# Patient Record
Sex: Male | Born: 1980 | Race: White | Hispanic: No | Marital: Married | Smoking: Never smoker
Health system: Southern US, Community
[De-identification: ages and names within clinical notes are randomized; demographics above are authoritative.]

## PROBLEM LIST (undated history)

## (undated) DIAGNOSIS — Z87442 Personal history of urinary calculi: Secondary | ICD-10-CM

## (undated) DIAGNOSIS — N200 Calculus of kidney: Secondary | ICD-10-CM

## (undated) DIAGNOSIS — M109 Gout, unspecified: Secondary | ICD-10-CM

## (undated) DIAGNOSIS — B019 Varicella without complication: Secondary | ICD-10-CM

## (undated) DIAGNOSIS — A389 Scarlet fever, uncomplicated: Secondary | ICD-10-CM

## (undated) HISTORY — DX: Varicella without complication: B01.9

## (undated) HISTORY — DX: Scarlet fever, uncomplicated: A38.9

## (undated) HISTORY — DX: Calculus of kidney: N20.0

---

## 1999-03-09 ENCOUNTER — Encounter: Admission: RE | Admit: 1999-03-09 | Discharge: 1999-03-24 | Payer: Self-pay | Admitting: *Deleted

## 2013-02-13 ENCOUNTER — Emergency Department: Payer: Self-pay | Admitting: Emergency Medicine

## 2013-04-30 ENCOUNTER — Emergency Department: Payer: Self-pay | Admitting: Emergency Medicine

## 2014-03-23 ENCOUNTER — Emergency Department: Payer: Self-pay | Admitting: Emergency Medicine

## 2014-04-02 ENCOUNTER — Emergency Department (HOSPITAL_COMMUNITY): Payer: Worker's Compensation

## 2014-04-02 ENCOUNTER — Encounter (HOSPITAL_COMMUNITY): Payer: Self-pay | Admitting: Adult Health

## 2014-04-02 ENCOUNTER — Emergency Department (HOSPITAL_COMMUNITY)
Admission: EM | Admit: 2014-04-02 | Discharge: 2014-04-02 | Disposition: A | Discharge status: Home / Self Care | Payer: Worker's Compensation | Attending: Emergency Medicine | Admitting: Emergency Medicine

## 2014-04-02 DIAGNOSIS — Y998 Other external cause status: Secondary | ICD-10-CM | POA: Diagnosis not present

## 2014-04-02 DIAGNOSIS — Y9289 Other specified places as the place of occurrence of the external cause: Secondary | ICD-10-CM | POA: Insufficient documentation

## 2014-04-02 DIAGNOSIS — Z79899 Other long term (current) drug therapy: Secondary | ICD-10-CM | POA: Insufficient documentation

## 2014-04-02 DIAGNOSIS — S20229A Contusion of unspecified back wall of thorax, initial encounter: Secondary | ICD-10-CM

## 2014-04-02 DIAGNOSIS — S199XXA Unspecified injury of neck, initial encounter: Secondary | ICD-10-CM | POA: Insufficient documentation

## 2014-04-02 DIAGNOSIS — S6991XA Unspecified injury of right wrist, hand and finger(s), initial encounter: Secondary | ICD-10-CM | POA: Insufficient documentation

## 2014-04-02 DIAGNOSIS — W19XXXA Unspecified fall, initial encounter: Secondary | ICD-10-CM

## 2014-04-02 DIAGNOSIS — M109 Gout, unspecified: Secondary | ICD-10-CM | POA: Insufficient documentation

## 2014-04-02 DIAGNOSIS — Y9389 Activity, other specified: Secondary | ICD-10-CM | POA: Diagnosis not present

## 2014-04-02 DIAGNOSIS — S6992XA Unspecified injury of left wrist, hand and finger(s), initial encounter: Secondary | ICD-10-CM | POA: Insufficient documentation

## 2014-04-02 DIAGNOSIS — Z791 Long term (current) use of non-steroidal anti-inflammatories (NSAID): Secondary | ICD-10-CM | POA: Insufficient documentation

## 2014-04-02 DIAGNOSIS — S29002A Unspecified injury of muscle and tendon of back wall of thorax, initial encounter: Secondary | ICD-10-CM | POA: Diagnosis present

## 2014-04-02 DIAGNOSIS — W11XXXA Fall on and from ladder, initial encounter: Secondary | ICD-10-CM | POA: Insufficient documentation

## 2014-04-02 HISTORY — DX: Gout, unspecified: M10.9

## 2014-04-02 LAB — COMPREHENSIVE METABOLIC PANEL
ALT: 45 U/L (ref 0–53)
AST: 36 U/L (ref 0–37)
Albumin: 4.1 g/dL (ref 3.5–5.2)
Alkaline Phosphatase: 53 U/L (ref 39–117)
Anion gap: 15 (ref 5–15)
BILIRUBIN TOTAL: 0.5 mg/dL (ref 0.3–1.2)
BUN: 15 mg/dL (ref 6–23)
CHLORIDE: 98 meq/L (ref 96–112)
CO2: 24 meq/L (ref 19–32)
Calcium: 9.7 mg/dL (ref 8.4–10.5)
Creatinine, Ser: 0.94 mg/dL (ref 0.50–1.35)
GFR calc Af Amer: >90 mL/min (ref >90)
Glucose, Bld: 101 mg/dL — ABNORMAL HIGH (ref 70–99)
Potassium: 3.7 mEq/L (ref 3.7–5.3)
SODIUM: 137 meq/L (ref 137–147)
Total Protein: 7.8 g/dL (ref 6.0–8.3)

## 2014-04-02 LAB — CBC
HCT: 44.7 % (ref 39.0–52.0)
Hemoglobin: 15.6 g/dL (ref 13.0–17.0)
MCH: 29.5 pg (ref 26.0–34.0)
MCHC: 34.9 g/dL (ref 30.0–36.0)
MCV: 84.5 fL (ref 78.0–100.0)
PLATELETS: 261 10*3/uL (ref 150–400)
RBC: 5.29 MIL/uL (ref 4.22–5.81)
RDW: 12.4 % (ref 11.5–15.5)
WBC: 12.5 10*3/uL — ABNORMAL HIGH (ref 4.0–10.5)

## 2014-04-02 LAB — SAMPLE TO BLOOD BANK

## 2014-04-02 MED ORDER — OXYCODONE-ACETAMINOPHEN 5-325 MG PO TABS: 1.0000 | ORAL_TABLET | ORAL | Status: DC | PRN

## 2014-04-02 MED ORDER — HYDROMORPHONE HCL 1 MG/ML IJ SOLN
1.0000 mg | Freq: Once | INTRAMUSCULAR | Status: AC
  Administered 2014-04-02: 1 mg via INTRAVENOUS
  Filled 2014-04-02: qty 1

## 2014-04-02 MED ORDER — IOHEXOL 300 MG/ML  SOLN
100.0000 mL | Freq: Once | INTRAMUSCULAR | Status: AC | PRN
Start: 2014-04-02 — End: 2014-04-02
  Administered 2014-04-02: 100 mL via INTRAVENOUS

## 2014-04-02 MED ORDER — CYCLOBENZAPRINE HCL 10 MG PO TABS: 10.0000 mg | ORAL_TABLET | Freq: Two times a day (BID) | ORAL | Status: DC | PRN

## 2014-04-02 MED ORDER — SODIUM CHLORIDE 0.9 % IV SOLN
1000.0000 mL | INTRAVENOUS | Status: DC
  Administered 2014-04-02: 1000 mL via INTRAVENOUS

## 2014-04-02 MED ORDER — SODIUM CHLORIDE 0.9 % IV SOLN
1000.0000 mL | Freq: Once | INTRAVENOUS | Status: AC
  Administered 2014-04-02: 1000 mL via INTRAVENOUS

## 2014-04-02 MED ORDER — HYDROMORPHONE HCL 1 MG/ML IJ SOLN
0.5000 mg | Freq: Once | INTRAMUSCULAR | Status: AC
  Administered 2014-04-02: 0.5 mg via INTRAVENOUS
  Filled 2014-04-02: qty 1

## 2014-04-02 NOTE — ED Notes (Signed)
Phlebotomy at the bedside  

## 2014-04-02 NOTE — ED Provider Notes (Signed)
CSN: [id number]     Arrival date & time 04/02/14  1644 History   First MD Initiated Contact with Patient 04/02/14 1645     Chief Complaint  Patient presents with  . Fall     (Consider location/radiation/quality/duration/timing/severity/associated sxs/prior Treatment) HPI Patient is an otherwise healthy 33 year old male who presents following a fall. The patient was standing approximately 9 feet up on a ladder when he slipped and fell backwards. He landed on his back, and thinks he may have lost consciousness, is unsure whether he hit his head. He complains of mid thoracic back pain, and a subjective feeling of numbness in bilateral upper extremities. He also complains of left hand pain, primarily in his thumb. He denies any weakness in his legs, but does feel a sensation of numbness over his right hip and anterior thigh.  He received 150 g of fentanyl by EMS which has improved his pain from 8 out of 10 to a 4 out of 10.   Past Medical History  Diagnosis Date  . Gout    History reviewed. No pertinent past surgical history. History reviewed. No pertinent family history. History  Substance Use Topics  . Smoking status: Never Smoker   . Smokeless tobacco: Not on file  . Alcohol Use: No    Review of Systems  Unable to perform ROS Musculoskeletal: Positive for back pain and neck pain.  Neurological: Positive for weakness (Bilateral upper extremities) and numbness. Negative for tremors (bilateral upper extremities).  All other systems reviewed and are negative.     Allergies  Review of patient's allergies indicates no known allergies.  Home Medications   Prior to Admission medications   Medication Sig Start Date End Date Taking? Authorizing Provider  DiphenhydrAMINE HCl (ALLERGY MEDICATION PO) Take 1 tablet by mouth daily as needed (for allergies).   Yes Historical Provider, MD  ibuprofen (ADVIL,MOTRIN) 200 MG tablet Take 200 mg by mouth every 6 (six) hours as needed for  moderate pain.    Yes Historical Provider, MD  neomycin-polymyxin-hydrocortisone (CORTISPORIN) 3.5-10000-1 ophthalmic suspension Place 1 drop into the left eye daily as needed (for eye infection).  03/23/14  Yes Historical Provider, MD  Pseudoephedrine HCl (SINUS DECONGESTANT PO) Take 1 tablet by mouth daily as needed (for sinuses).   Yes Historical Provider, MD  cyclobenzaprine (FLEXERIL) 10 MG tablet Take 1 tablet (10 mg total) by mouth 2 (two) times daily as needed for muscle spasms. 04/02/14   Erskine Emeryhris Rory Montel, MD  oxyCODONE-acetaminophen (PERCOCET/ROXICET) 5-325 MG per tablet Take 1-2 tablets by mouth every 4 (four) hours as needed for moderate pain or severe pain. 04/02/14   Erskine Emeryhris Gulianna Hornsby, MD   BP 107/60 mmHg  Pulse 78  Temp(Src) 98.1 F (36.7 C) (Oral)  Resp 13  SpO2 95% Physical Exam  Constitutional: He appears well-developed and well-nourished. No distress.  HENT:  Head: Normocephalic and atraumatic.  No hemotympanum or nasal septal hematoma, no signs of basilar skull fracture  Eyes: EOM are normal. Pupils are equal, round, and reactive to light.  Pupils 2-3 mm reactive bilaterally  Neck:  No tenderness to palpation to the cervical spine  Cardiovascular: Normal rate and regular rhythm.   Pulmonary/Chest: Effort normal and breath sounds normal. No respiratory distress.  Abdominal: Soft. He exhibits no distension. There is no tenderness.  Musculoskeletal:  Pain upon palpation to the left thumb at the MP joint Bilateral upper and lower extremities appear atraumatic  Neurological:  3 out of 5 strength in bilateral upper and lower extremities.  Subjective decrease in sensation in bilateral upper extremities in all nerve distributions  Skin: Skin is warm and dry.  Psychiatric: He has a normal mood and affect.  Nursing note and vitals reviewed.   ED Course  Procedures (including critical care time) Labs Review Labs Reviewed  COMPREHENSIVE METABOLIC PANEL - Abnormal; Notable for the  following:    Glucose, Bld 101 (*)    All other components within normal limits  CBC - Abnormal; Notable for the following:    WBC 12.5 (*)    All other components within normal limits  URINE RAPID DRUG SCREEN (HOSP PERFORMED)  SAMPLE TO BLOOD BANK    Imaging Review Ct Head Wo Contrast  04/02/2014   CLINICAL DATA:  Status post fall off a 8 foot ladder and landed on concrete. Low back pain.  EXAM: CT HEAD WITHOUT CONTRAST  CT CERVICAL SPINE WITHOUT CONTRAST  TECHNIQUE: Multidetector CT imaging of the head and cervical spine was performed following the standard protocol without intravenous contrast. Multiplanar CT image reconstructions of the cervical spine were also generated.  COMPARISON:  None.  FINDINGS: CT HEAD FINDINGS  There is no evidence of mass effect, midline shift or extra-axial fluid collections. There is no evidence of a space-occupying lesion or intracranial hemorrhage. There is no evidence of a cortical-based area of acute infarction.  The ventricles and sulci are appropriate for the patient's age. The basal cisterns are patent.  Visualized portions of the orbits are unremarkable. The visualized portions of the paranasal sinuses and mastoid air cells are unremarkable.  The osseous structures are unremarkable.  CT CERVICAL SPINE FINDINGS  The alignment is anatomic. The vertebral body heights are maintained. There is no acute fracture. There is no static listhesis. The prevertebral soft tissues are normal. The intraspinal soft tissues are not fully imaged on this examination due to poor soft tissue contrast, but there is no gross soft tissue abnormality.  The disc spaces are maintained.  The visualized portions of the lung apices demonstrate no focal abnormality.  IMPRESSION: 1. Normal CT of the brain without intravenous contrast. 2. No acute osseous injury of the cervical spine.   Electronically Signed   By: Elige Ko   On: 04/02/2014 18:08   Ct Chest W Contrast  04/02/2014    CLINICAL DATA:  Fall from 8 foot ladder with low back pain and bilateral hand and thigh and numbness, initial encounter  EXAM: CT CHEST, ABDOMEN, AND PELVIS WITH CONTRAST  TECHNIQUE: Multidetector CT imaging of the chest, abdomen and pelvis was performed following the standard protocol during bolus administration of intravenous contrast.  CONTRAST:  OMNIPAQUE IOHEXOL 300 MG/ML  SOLN  COMPARISON:  None.  FINDINGS: CT CHEST FINDINGS  The lungs are well aerated bilaterally. Some very mild dependent atelectatic changes are noted bilaterally. No focal infiltrate or parenchymal nodules are seen. The thoracic inlet is within normal limits.  The thoracic aorta is within normal limits without evidence of dissection or aneurysmal dilatation. The pulmonary artery as visualized is within normal limits. No hilar or mediastinal adenopathy is seen.  No acute bony abnormality is noted.  CT ABDOMEN AND PELVIS FINDINGS  Liver is diffusely fatty infiltrated. The gallbladder, spleen, adrenal glands and pancreas are all normal in their CT appearance. The kidneys are well visualized bilaterally and reveal no focal injury. No obstructive changes are seen. The bladder is well distended.  No free pelvic fluid is seen. The appendix is within normal limits. No acute bony abnormality is noted.  IMPRESSION: No acute abnormality is identified in the chest, abdomen and pelvis related to the recent injury.   Electronically Signed   By: Alcide CleverMark  Lukens M.D.   On: 04/02/2014 18:06   Ct Cervical Spine Wo Contrast  04/02/2014   CLINICAL DATA:  Status post fall off a 8 foot ladder and landed on concrete. Low back pain.  EXAM: CT HEAD WITHOUT CONTRAST  CT CERVICAL SPINE WITHOUT CONTRAST  TECHNIQUE: Multidetector CT imaging of the head and cervical spine was performed following the standard protocol without intravenous contrast. Multiplanar CT image reconstructions of the cervical spine were also generated.  COMPARISON:  None.  FINDINGS: CT HEAD  FINDINGS  There is no evidence of mass effect, midline shift or extra-axial fluid collections. There is no evidence of a space-occupying lesion or intracranial hemorrhage. There is no evidence of a cortical-based area of acute infarction.  The ventricles and sulci are appropriate for the patient's age. The basal cisterns are patent.  Visualized portions of the orbits are unremarkable. The visualized portions of the paranasal sinuses and mastoid air cells are unremarkable.  The osseous structures are unremarkable.  CT CERVICAL SPINE FINDINGS  The alignment is anatomic. The vertebral body heights are maintained. There is no acute fracture. There is no static listhesis. The prevertebral soft tissues are normal. The intraspinal soft tissues are not fully imaged on this examination due to poor soft tissue contrast, but there is no gross soft tissue abnormality.  The disc spaces are maintained.  The visualized portions of the lung apices demonstrate no focal abnormality.  IMPRESSION: 1. Normal CT of the brain without intravenous contrast. 2. No acute osseous injury of the cervical spine.   Electronically Signed   By: Elige KoHetal  Patel   On: 04/02/2014 18:08   Ct Abdomen Pelvis W Contrast  04/02/2014   CLINICAL DATA:  Fall from 8 foot ladder with low back pain and bilateral hand and thigh and numbness, initial encounter  EXAM: CT CHEST, ABDOMEN, AND PELVIS WITH CONTRAST  TECHNIQUE: Multidetector CT imaging of the chest, abdomen and pelvis was performed following the standard protocol during bolus administration of intravenous contrast.  CONTRAST:  100mL OMNIPAQUE IOHEXOL 300 MG/ML  SOLN  COMPARISON:  None.  FINDINGS: CT CHEST FINDINGS  The lungs are well aerated bilaterally. Some very mild dependent atelectatic changes are noted bilaterally. No focal infiltrate or parenchymal nodules are seen. The thoracic inlet is within normal limits.  The thoracic aorta is within normal limits without evidence of dissection or aneurysmal  dilatation. The pulmonary artery as visualized is within normal limits. No hilar or mediastinal adenopathy is seen.  No acute bony abnormality is noted.  CT ABDOMEN AND PELVIS FINDINGS  Liver is diffusely fatty infiltrated. The gallbladder, spleen, adrenal glands and pancreas are all normal in their CT appearance. The kidneys are well visualized bilaterally and reveal no focal injury. No obstructive changes are seen. The bladder is well distended.  No free pelvic fluid is seen. The appendix is within normal limits. No acute bony abnormality is noted.  IMPRESSION: No acute abnormality is identified in the chest, abdomen and pelvis related to the recent injury.   Electronically Signed   By: Alcide CleverMark  Lukens M.D.   On: 04/02/2014 18:06   Dg Hand Complete Left  04/02/2014   CLINICAL DATA:  Status post fall, left hand pain  EXAM: LEFT HAND - COMPLETE 3+ VIEW  COMPARISON:  None.  FINDINGS: There is no evidence of fracture or dislocation. There is  no evidence of arthropathy or other focal bone abnormality. Soft tissues are unremarkable.  IMPRESSION: Negative.   Electronically Signed   By: Elige Ko   On: 04/02/2014 18:12     EKG Interpretation None      MDM   Final diagnoses:  Fall  Back contusion, unspecified laterality, initial encounter    33 year old male presenting with thoracic back pain. Fell partially 8 feet landing on his upper back. Pain and tenderness along his thoracic spine but no step-offs or deformity. Initially had diminished strength in bilateral upper and lower extremities, however after pain control strength returned and was 5 out of 5 in upper and lower extremities. He had no decreased sensation in any of his extremities, however did describe paresthesias involving the entire circumference of all 4 fingers distal to the MCP joint bilaterally. He was able to discriminate 2 points, and sharp and dull stimuli. CT did not show any acute injuries to the thoracic back, no chest trauma no  abdominal trauma bleeding or pelvic fracture. CT of the head does not show any abnormalities, and there is no fracture set by the cervical spine.  After multiple doses of pain medication, the patient's pain was well controlled, and with no acute injuries identified on physical exam or CT, we'll discharge home with return precautions.   Erskine Emery, MD 04/02/14 1610  Gerhard Munch, MD 04/03/14 [phone]

## 2014-04-02 NOTE — ED Notes (Signed)
Patient returned from CT and Xray

## 2014-04-02 NOTE — ED Notes (Signed)
MD Davis at the bedside.  

## 2014-04-02 NOTE — ED Notes (Signed)
Presents with fall from 9 feet on ladder, landed on concrete  On right side-c/o numbness  in both upper extremities and pain in spine below shoulder blades.  MAE x4. Pain rated 8/10 initially-given 150 mcg of fentanyl by EMS and believes left thumb is broken. Good grips bilaterally.

## 2014-04-02 NOTE — Discharge Instructions (Signed)
Blunt Chest Trauma Blunt chest trauma is an injury caused by a blow to the chest. These chest injuries can be very painful. Blunt chest trauma often results in bruised or broken (fractured) ribs. Most cases of bruised and fractured ribs from blunt chest traumas get better after 1 to 3 weeks of rest and pain medicine. Often, the soft tissue in the chest wall is also injured, causing pain and bruising. Internal organs, such as the heart and lungs, may also be injured. Blunt chest trauma can lead to serious medical problems. This injury requires immediate medical care. CAUSES   Motor vehicle collisions.  Falls.  Physical violence.  Sports injuries. SYMPTOMS   Chest pain. The pain may be worse when you move or breathe deeply.  Shortness of breath.  Lightheadedness.  Bruising.  Tenderness.  Swelling. DIAGNOSIS  Your caregiver will do a physical exam. X-rays may be taken to look for fractures. However, minor rib fractures may not show up on X-rays until a few days after the injury. If a more serious injury is suspected, further imaging tests may be done. This may include ultrasounds, computed tomography (CT) scans, or magnetic resonance imaging (MRI). TREATMENT  Treatment depends on the severity of your injury. Your caregiver may prescribe pain medicines and deep breathing exercises. HOME CARE INSTRUCTIONS  Limit your activities until you can move around without much pain.  Do not do any strenuous work until your injury is healed.  Put ice on the injured area.  Put ice in a plastic bag.  Place a towel between your skin and the bag.  Leave the ice on for 15-20 minutes, 03-04 times a day.  You may wear a rib belt as directed by your caregiver to reduce pain.  Practice deep breathing as directed by your caregiver to keep your lungs clear.  Only take over-the-counter or prescription medicines for pain, fever, or discomfort as directed by your caregiver. SEEK IMMEDIATE MEDICAL  CARE IF:   You have increasing pain or shortness of breath.  You cough up blood.  You have nausea, vomiting, or abdominal pain.  You have a fever.  You feel dizzy, weak, or you faint. MAKE SURE YOU:  Understand these instructions.  Will watch your condition. Will get help right away if you are not doing well or get worse.Back Pain, Adult Low back pain is very common. About 1 in 5 people have back pain.The cause of low back pain is rarely dangerous. The pain often gets better over time.About half of people with a sudden onset of back pain feel better in just 2 weeks. About 8 in 10 people feel better by 6 weeks.  CAUSES Some common causes of back pain include: Strain of the muscles or ligaments supporting the spine. Wear and tear (degeneration) of the spinal discs. Arthritis. Direct injury to the back. DIAGNOSIS Most of the time, the direct cause of low back pain is not known.However, back pain can be treated effectively even when the exact cause of the pain is unknown.Answering your caregiver's questions about your overall health and symptoms is one of the most accurate ways to make sure the cause of your pain is not dangerous. If your caregiver needs more information, he or she may order lab work or imaging tests (X-rays or MRIs).However, even if imaging tests show changes in your back, this usually does not require surgery. HOME CARE INSTRUCTIONS For many people, back pain returns.Since low back pain is rarely dangerous, it is often a condition that  people can learn to [facility] [place] [facility]on their own.  Remain active. It is stressful on the back to sit or stand in one place. Do not sit, drive, or stand in one place for more than 30 minutes at a time. Take short walks on level surfaces as soon as pain allows.Try to increase the length of time you walk each day. Do not stay in bed.Resting more than 1 or 2 days can delay your recovery. Do not avoid exercise or work.Your body is made to  move.It is not dangerous to be active, even though your back may hurt.Your back will likely heal faster if you return to being active before your pain is gone. Pay attention to your body when you bend and lift. Many people have less discomfortwhen lifting if they bend their knees, keep the load close to their bodies,and avoid twisting. Often, the most comfortable positions are those that put less stress on your recovering back. Find a comfortable position to sleep. Use a firm mattress and lie on your side with your knees slightly bent. If you lie on your back, put a pillow under your knees. Only take over-the-counter or prescription medicines as directed by your caregiver. Over-the-counter medicines to reduce pain and inflammation are often the most helpful.Your caregiver may prescribe muscle relaxant drugs.These medicines help dull your pain so you can more quickly return to your normal activities and healthy exercise. Put ice on the injured area. Put ice in a plastic bag. Place a towel between your skin and the bag. Leave the ice on for 15-20 minutes, 03-04 times a day for the first 2 to 3 days. After that, ice and heat may be alternated to reduce pain and spasms. Ask your caregiver about trying back exercises and gentle massage. This may be of some benefit. Avoid feeling anxious or stressed.Stress increases muscle tension and can worsen back pain.It is important to recognize when you are anxious or stressed and learn ways to manage it.Exercise is a great option. SEEK MEDICAL CARE IF: You have pain that is not relieved with rest or medicine. You have pain that does not improve in 1 week. You have new symptoms. You are generally not feeling well. SEEK IMMEDIATE MEDICAL CARE IF:  You have pain that radiates from your back into your legs. You develop new bowel or bladder control problems. You have unusual weakness or numbness in your arms or legs. You develop nausea or vomiting. You  develop abdominal pain. You feel faint. Document Released: 04/26/2005 Document Revised: 10/26/2011 Document Reviewed: 08/28/2013 [facility] Patient Information 2015 [employer], [place]. This information is not intended to replace advice given to you by your health care provider. Make sure you discuss any questions you have with your health care provider.   Document Released: 06/03/2004 Document Revised: 07/19/2011 Document Reviewed: 02/10/2011 [facility] Patient Information 2015 [place], [place]. This information is not intended to replace advice given to you by your health care provider. Make sure you discuss any questions you have with your health care provider.

## 2014-05-23 ENCOUNTER — Ambulatory Visit: Payer: Self-pay | Admitting: Internal Medicine

## 2014-07-02 ENCOUNTER — Ambulatory Visit: Payer: Self-pay | Admitting: Internal Medicine

## 2014-08-08 ENCOUNTER — Emergency Department: Admit: 2014-08-08 | Disposition: A | Discharge status: Home / Self Care | Payer: Self-pay | Admitting: Student

## 2014-08-15 ENCOUNTER — Emergency Department (HOSPITAL_COMMUNITY)
Admission: EM | Admit: 2014-08-15 | Discharge: 2014-08-15 | Disposition: A | Discharge status: Home / Self Care | Payer: Worker's Compensation | Attending: Emergency Medicine | Admitting: Emergency Medicine

## 2014-08-15 ENCOUNTER — Emergency Department (HOSPITAL_COMMUNITY): Payer: Worker's Compensation

## 2014-08-15 ENCOUNTER — Encounter (HOSPITAL_COMMUNITY): Payer: Self-pay | Admitting: Physical Medicine and Rehabilitation

## 2014-08-15 DIAGNOSIS — S6992XA Unspecified injury of left wrist, hand and finger(s), initial encounter: Secondary | ICD-10-CM | POA: Diagnosis present

## 2014-08-15 DIAGNOSIS — Y9289 Other specified places as the place of occurrence of the external cause: Secondary | ICD-10-CM | POA: Insufficient documentation

## 2014-08-15 DIAGNOSIS — Y998 Other external cause status: Secondary | ICD-10-CM | POA: Insufficient documentation

## 2014-08-15 DIAGNOSIS — Z79899 Other long term (current) drug therapy: Secondary | ICD-10-CM | POA: Insufficient documentation

## 2014-08-15 DIAGNOSIS — Z792 Long term (current) use of antibiotics: Secondary | ICD-10-CM | POA: Diagnosis not present

## 2014-08-15 DIAGNOSIS — S63602A Unspecified sprain of left thumb, initial encounter: Secondary | ICD-10-CM | POA: Diagnosis not present

## 2014-08-15 DIAGNOSIS — Z8739 Personal history of other diseases of the musculoskeletal system and connective tissue: Secondary | ICD-10-CM | POA: Insufficient documentation

## 2014-08-15 DIAGNOSIS — Y9389 Activity, other specified: Secondary | ICD-10-CM | POA: Insufficient documentation

## 2014-08-15 DIAGNOSIS — X58XXXA Exposure to other specified factors, initial encounter: Secondary | ICD-10-CM | POA: Insufficient documentation

## 2014-08-15 DIAGNOSIS — Y990 Civilian activity done for income or pay: Secondary | ICD-10-CM

## 2014-08-15 MED ORDER — OXYCODONE-ACETAMINOPHEN 5-325 MG PO TABS
1.0000 | ORAL_TABLET | Freq: Once | ORAL | Status: DC
  Filled 2014-08-15: qty 1

## 2014-08-15 MED ORDER — IBUPROFEN 400 MG PO TABS
600.0000 mg | ORAL_TABLET | Freq: Once | ORAL | Status: AC
Start: 2014-08-15 — End: 2014-08-15
  Administered 2014-08-15: 600 mg via ORAL
  Filled 2014-08-15: qty 2

## 2014-08-15 MED ORDER — HYDROCODONE-ACETAMINOPHEN 5-325 MG PO TABS: 1.0000 | ORAL_TABLET | ORAL | Status: DC | PRN

## 2014-08-15 MED ORDER — IBUPROFEN 600 MG PO TABS: 600.0000 mg | ORAL_TABLET | Freq: Four times a day (QID) | ORAL | Status: DC | PRN

## 2014-08-15 NOTE — ED Notes (Addendum)
Pt presents to department for evaluation of L thumb injury. States he injured L thumb this morning at work. 8/10 pain upon arrival to ED. Able to wiggle digit. No obvious deformity noted.

## 2014-08-15 NOTE — ED Notes (Signed)
Called phlebotomy to come get a workers comp urine drug screen.

## 2014-08-15 NOTE — ED Notes (Signed)
Pt chart being used by registration, unable to get into document for e-sig. Pt understands d/c instructions.

## 2014-08-15 NOTE — ED Notes (Signed)
Thumb splint applied to pt's Left thumb. Pt shown how to adjust splint. Pt adjusting splint to comfort.

## 2014-08-15 NOTE — ED Notes (Addendum)
Small Ice Pack given to pt.

## 2014-08-15 NOTE — ED Provider Notes (Signed)
CSN: [id number]     Arrival date & time 08/15/14  1006 History  This chart was scribed for non-physician practitioner, Junius FinnerErin O'Malley, PA-C, working with Zadie Rhineonald Wickline, MD by Charline BillsEssence Howell, ED Scribe. This patient was seen in room TR07C/TR07C and the patient's care was started at 10:19 AM.   Chief Complaint  Patient presents with  . Finger Injury   The history is provided by the patient. No language interpreter was used.   HPI Comments: Randall Stanley is a 34 y.o. male, with a h/o gout, who presents to the ED complaining of persistent L thumb pain since 9:10 AM this morning. Pt states that he injured his thumb this morning while at work. Pt states that he heard a "pop" prior to onset of pain. He currently rates his pain as a 8/10, worse with movement and describes a "cold" sensation to his thumb. He denies L wrist pain, numbness/tingling, h/o previous L thumb injury, h/o DM. Pt has tried 800 mg ibuprofen. No other alleviating or aggravating factors. Pt is R hand dominant. No known allergies.   Past Medical History  Diagnosis Date  . Gout    History reviewed. No pertinent past surgical history. History reviewed. No pertinent family history. History  Substance Use Topics  . Smoking status: Never Smoker   . Smokeless tobacco: Not on file  . Alcohol Use: No    Review of Systems  Musculoskeletal: Positive for arthralgias.  Neurological: Negative for numbness.   Allergies  Review of patient's allergies indicates no known allergies.  Home Medications   Prior to Admission medications   Medication Sig Start Date End Date Taking? Authorizing Provider  cyclobenzaprine (FLEXERIL) 10 MG tablet Take 1 tablet (10 mg total) by mouth 2 (two) times daily as needed for muscle spasms. 04/02/14   Erskine Emeryhris Davis, MD  DiphenhydrAMINE HCl (ALLERGY MEDICATION PO) Take 1 tablet by mouth daily as needed (for allergies).    Historical Provider, MD  HYDROcodone-acetaminophen (NORCO/VICODIN) 5-325 MG per  tablet Take 1-2 tablets by mouth every 4 (four) hours as needed. 08/15/14   Junius FinnerErin O'Malley, PA-C  ibuprofen (ADVIL,MOTRIN) 600 MG tablet Take 1 tablet (600 mg total) by mouth every 6 (six) hours as needed. 08/15/14   Junius FinnerErin O'Malley, PA-C  indomethacin (INDOCIN) 50 MG capsule Take 50 mg by mouth daily as needed (for gout).     Historical Provider, MD  neomycin-polymyxin-hydrocortisone (CORTISPORIN) 3.5-10000-1 ophthalmic suspension Place 1 drop into the left eye daily as needed (for eye infection).  03/23/14   Historical Provider, MD  oxyCODONE-acetaminophen (PERCOCET/ROXICET) 5-325 MG per tablet Take 1-2 tablets by mouth every 4 (four) hours as needed for moderate pain or severe pain. 04/02/14   Erskine Emeryhris Davis, MD  Pseudoephedrine HCl (SINUS DECONGESTANT PO) Take 1 tablet by mouth daily as needed (for sinuses).    Historical Provider, MD  traMADol (ULTRAM) 50 MG tablet Take 50 mg by mouth every 6 (six) hours as needed for moderate pain.     Historical Provider, MD   BP 133/87 mmHg  Pulse 85  Temp(Src) 98.3 F (36.8 C) (Oral)  Resp 18  SpO2 99% Physical Exam  Constitutional: He is oriented to person, place, and time. He appears well-developed and well-nourished.  HENT:  Head: Normocephalic and atraumatic.  Eyes: EOM are normal.  Neck: Normal range of motion.  Cardiovascular: Normal rate.   Capillary refill less than 3 seconds.   Pulmonary/Chest: Effort normal.  Musculoskeletal: Normal range of motion. He exhibits tenderness. He exhibits no edema.  L hand: no obvious deformity. Tenderness to lateral aspect of L thumb near the MCP joint. Decreased flexion and extension of L thumb due to pain. 5/5 grip strength.  FROM left wrist w/o tenderness. No snuff box tenderness  Neurological: He is alert and oriented to person, place, and time.  Sensation intact to left thumb and hand.  Skin: Skin is warm and dry. No erythema.  Skin in tact, no ecchymosis or erythema  Psychiatric: He has a normal mood and  affect. His behavior is normal.  Nursing note and vitals reviewed.  ED Course  Procedures (including critical care time) DIAGNOSTIC STUDIES: Oxygen Saturation is 99% on RA, normal by my interpretation.    COORDINATION OF CARE: 10:22 AM-Discussed treatment plan which includes XR and Percocet with pt at bedside and pt agreed to plan.   Labs Review Labs Reviewed - No data to display  Imaging Review Dg Hand Complete Left  08/15/2014   CLINICAL DATA:  LEFT hand injury.  Acute injury.  Initial encounter.  EXAM: LEFT HAND - COMPLETE 3+ VIEW  COMPARISON:  04/02/2014.  FINDINGS: There is no evidence of fracture or dislocation. There is no evidence of arthropathy or other focal bone abnormality. Soft tissues are unremarkable. Specifically, the alignment of the LEFT thumb appears within normal limits. Lateral view suboptimal because of extension of the thumb.  IMPRESSION: No interval change or acute osseous abnormality.   Electronically Signed   By: Andreas Newport M.D.   On: 08/15/2014 11:27    EKG Interpretation None      MDM   Final diagnoses:  Work related injury  Left thumb sprain, initial encounter    Pt is a 34yo male presenting to ED with c/o Left thumb pain after injury at work. Left thumb and hand are neurovascularly in tact. Skin in tact w/o ecchymosis or erythema.  Plain films: no interval change or acute osseous abnormality.  Will tx as sprain.  Thumb spica splint applied. Home care instructions provided. Advised to f/u with hand surgery, Dr. Mina Marble in 1 week if symptoms not improving. F/u with [facility] to establish care with a PCP for recheck of symptoms and ongoing healthcare needs. Pt verbalized understanding and agreement with tx plan. Work note provided. Worker's comp labs and paperwork completed by phlebotomy and registration while pt in ED.   I personally performed the services described in this documentation, which was scribed in my presence. The recorded information has been  reviewed and is accurate.     Junius Finner, PA-C 08/15/14 [address], PA-C 08/15/14 1623  Zadie Rhine, MD 08/16/14 [phone]

## 2014-12-15 ENCOUNTER — Encounter: Payer: Self-pay | Admitting: Emergency Medicine

## 2014-12-15 ENCOUNTER — Emergency Department
Admission: EM | Admit: 2014-12-15 | Discharge: 2014-12-15 | Disposition: A | Discharge status: Home / Self Care | Payer: Self-pay | Attending: Emergency Medicine | Admitting: Emergency Medicine

## 2014-12-15 DIAGNOSIS — Z792 Long term (current) use of antibiotics: Secondary | ICD-10-CM | POA: Insufficient documentation

## 2014-12-15 DIAGNOSIS — L089 Local infection of the skin and subcutaneous tissue, unspecified: Secondary | ICD-10-CM | POA: Insufficient documentation

## 2014-12-15 DIAGNOSIS — Z79899 Other long term (current) drug therapy: Secondary | ICD-10-CM | POA: Insufficient documentation

## 2014-12-15 DIAGNOSIS — L723 Sebaceous cyst: Secondary | ICD-10-CM | POA: Insufficient documentation

## 2014-12-15 MED ORDER — SULFAMETHOXAZOLE-TRIMETHOPRIM 800-160 MG PO TABS: 1.0000 | ORAL_TABLET | Freq: Two times a day (BID) | ORAL | Status: DC

## 2014-12-15 NOTE — Discharge Instructions (Signed)
Epidermal Cyst An epidermal cyst is sometimes called a sebaceous cyst, epidermal inclusion cyst, or infundibular cyst. These cysts usually contain a substance that looks "pasty" or "cheesy" and may have a bad smell. This substance is a protein called keratin. Epidermal cysts are usually found on the face, neck, or trunk. They may also occur in the vaginal area or other parts of the genitalia of both men and women. Epidermal cysts are usually small, painless, slow-growing bumps or lumps that move freely under the skin. It is important not to try to pop them. This may cause an infection and lead to tenderness and swelling. CAUSES  Epidermal cysts may be caused by a deep penetrating injury to the skin or a plugged hair follicle, often associated with acne. SYMPTOMS  Epidermal cysts can become inflamed and cause:  Redness.  Tenderness.  Increased temperature of the skin over the bumps or lumps.  Grayish-white, bad smelling material that drains from the bump or lump. DIAGNOSIS  Epidermal cysts are easily diagnosed by your caregiver during an exam. Rarely, a tissue sample (biopsy) may be taken to rule out other conditions that may resemble epidermal cysts. TREATMENT   Epidermal cysts often get better and disappear on their own. They are rarely ever cancerous.  If a cyst becomes infected, it may become inflamed and tender. This may require opening and draining the cyst. Treatment with antibiotics may be necessary. When the infection is gone, the cyst may be removed with minor surgery.  Small, inflamed cysts can often be treated with antibiotics or by injecting steroid medicines.  Sometimes, epidermal cysts become large and bothersome. If this happens, surgical removal in your caregiver's office may be necessary. HOME CARE INSTRUCTIONS  Only take over-the-counter or prescription medicines as directed by your caregiver.  Take your antibiotics as directed. Finish them even if you start to feel  better. SEEK MEDICAL CARE IF:   Your cyst becomes tender, red, or swollen.  Your condition is not improving or is getting worse.  You have any other questions or concerns. MAKE SURE YOU:  Understand these instructions.  Will watch your condition.  Will get help right away if you are not doing well or get worse. Document Released: 03/27/2004 Document Revised: 07/19/2011 Document Reviewed: 11/02/2010 [facility] Patient Information 2015 [place], [place]. This information is not intended to replace advice given to you by your health care provider. Make sure you discuss any questions you have with your health care provider.  Your sebaceous (epidermal) cyst may be infected.  Take the antibiotic as directed for promote healing. Apply warm compresses to help develop or resolve them.  Return as needed for wound check and possible incision & drainage.

## 2014-12-15 NOTE — ED Notes (Signed)
States he felt a swollen area behind right ear about 1 week ago. Then this am noticed another pain which is painful  No fever or trauma

## 2014-12-15 NOTE — ED Provider Notes (Signed)
[facility] Emergency Department Provider Note ____________________________________________  Time seen: 1342  I have reviewed the triage vital signs and the nursing notes.  HISTORY  Chief Complaint  Mass  HPI Randall Stanley is a 34 y.o. male reports to the ED for evaluation of a tender swollen area behind the right ear for about a week. Denies any fevers, chills, sweats, or recent ear infection.  Past Medical History  Diagnosis Date  . Gout     There are no active problems to display for this patient.   History reviewed. No pertinent past surgical history.  Current Outpatient Rx  Name  Route  Sig  Dispense  Refill  . cyclobenzaprine (FLEXERIL) 10 MG tablet   Oral   Take 1 tablet (10 mg total) by mouth 2 (two) times daily as needed for muscle spasms.   30 tablet   0   . DiphenhydrAMINE HCl (ALLERGY MEDICATION PO)   Oral   Take 1 tablet by mouth daily as needed (for allergies).         Marland Kitchen HYDROcodone-acetaminophen (NORCO/VICODIN) 5-325 MG per tablet   Oral   Take 1-2 tablets by mouth every 4 (four) hours as needed.   10 tablet   0   . ibuprofen (ADVIL,MOTRIN) 600 MG tablet   Oral   Take 1 tablet (600 mg total) by mouth every 6 (six) hours as needed.   30 tablet   0   . indomethacin (INDOCIN) 50 MG capsule   Oral   Take 50 mg by mouth daily as needed (for gout).          Marland Kitchen neomycin-polymyxin-hydrocortisone (CORTISPORIN) 3.5-10000-1 ophthalmic suspension   Left Eye   Place 1 drop into the left eye daily as needed (for eye infection).          Marland Kitchen oxyCODONE-acetaminophen (PERCOCET/ROXICET) 5-325 MG per tablet   Oral   Take 1-2 tablets by mouth every 4 (four) hours as needed for moderate pain or severe pain.   15 tablet   0   . Pseudoephedrine HCl (SINUS DECONGESTANT PO)   Oral   Take 1 tablet by mouth daily as needed (for sinuses).         Marland Kitchen sulfamethoxazole-trimethoprim (BACTRIM DS,SEPTRA DS) 800-160 MG per tablet   Oral  Take 1 tablet by mouth 2 (two) times daily.   20 tablet   0   . traMADol (ULTRAM) 50 MG tablet   Oral   Take 50 mg by mouth every 6 (six) hours as needed for moderate pain.           Allergies Review of patient's allergies indicates no known allergies.  No family history on file.  Social History History  Substance Use Topics  . Smoking status: Never Smoker   . Smokeless tobacco: Not on file  . Alcohol Use: Yes     Comment: occasional   Review of Systems  Constitutional: Negative for fever. Eyes: Negative for visual changes. ENT: Negative for sore throat. Cardiovascular: Negative for chest pain. Respiratory: Negative for shortness of breath. Gastrointestinal: Negative for abdominal pain, vomiting and diarrhea. Genitourinary: Negative for dysuria. Musculoskeletal: Negative for back pain. Skin: Negative for rash. Tender, firm nodule as above. Neurological: Negative for headaches, focal weakness or numbness. ____________________________________________  PHYSICAL EXAM:  VITAL SIGNS: ED Triage Vitals  Enc Vitals Group     BP 12/15/14 1258 122/82 mmHg     Pulse Rate 12/15/14 1258 79     Resp 12/15/14 1258 16  Temp 12/15/14 1258 98 F (36.7 C)     Temp Source 12/15/14 1258 Oral     SpO2 12/15/14 1258 97 %     Weight 12/15/14 1258 235 lb (106.595 kg)     Height 12/15/14 1258  (1.753 m)     Head Cir --      Peak Flow --      Pain Score 12/15/14 1259 6     Pain Loc --      Pain Edu? --      Excl. in GC? --    Constitutional: Alert and oriented. Well appearing and in no distress. Eyes: Conjunctivae are normal. PERRL. Normal extraocular movements. ENT   Head: Normocephalic and atraumatic, except for a firm, rounded, cystic lesion deep to the skin at the occipital hairline on the left. No punctum, induration, erythema, or warmth appreciated.    Nose: No congestion/rhinnorhea.      Ears: TMs clear bilaterally. No mastoid erythema or tenderness.     Mouth/Throat: Mucous membranes are moist.   Neck: Supple. No thyromegaly. Hematological/Lymphatic/Immunilogical: No cervical, prearuicular, or occipital lymphadenopathy. Cardiovascular: Normal rate, regular rhythm.  Respiratory: Normal respiratory effort. No wheezes/rales/rhonchi. Gastrointestinal: Soft and nontender. No distention. Musculoskeletal: Nontender with normal range of motion in all extremities.  Neurologic:  Normal gait without ataxia. Normal speech and language. No gross focal neurologic deficits are appreciated. Skin:  Skin is warm, dry and intact. No rash noted. Psychiatric: Mood and affect are normal. Patient exhibits appropriate insight and judgment. ____________________________________________  INITIAL IMPRESSION / ASSESSMENT AND PLAN / ED COURSE  Cystic lesion consistent with inflamed, deep, sebaceous cyst without abscess formation. Will treat conservatively with warm compresses and Bactrim DS.  Patient to return to the ED for wound check and potential I & D.  ____________________________________________  FINAL CLINICAL IMPRESSION(S) / ED DIAGNOSES  Final diagnoses:  Inflamed sebaceous cyst     Lissa Hoard, PA-C 12/15/14 1633  Jene Every, MD 12/16/14 1116

## 2014-12-15 NOTE — ED Notes (Signed)
Patient presents to the ED with complaint of multiple raised areas behind patient's right ear.  One raised area, about the size of a nickel has been there 1 week per patient and he noticed two more smaller areas above it this morning.  Patient states areas area slightly tender and today patient is complaining of a slight headache that he feels may be related.  Patient is in no obvious distress at this time.

## 2015-05-13 ENCOUNTER — Encounter (HOSPITAL_COMMUNITY): Payer: Self-pay | Admitting: *Deleted

## 2015-05-13 ENCOUNTER — Emergency Department (HOSPITAL_COMMUNITY)
Admission: EM | Admit: 2015-05-13 | Discharge: 2015-05-13 | Disposition: A | Discharge status: Home / Self Care | Payer: Self-pay | Attending: Emergency Medicine | Admitting: Emergency Medicine

## 2015-05-13 DIAGNOSIS — Z792 Long term (current) use of antibiotics: Secondary | ICD-10-CM | POA: Insufficient documentation

## 2015-05-13 DIAGNOSIS — W01190A Fall on same level from slipping, tripping and stumbling with subsequent striking against furniture, initial encounter: Secondary | ICD-10-CM | POA: Insufficient documentation

## 2015-05-13 DIAGNOSIS — Y999 Unspecified external cause status: Secondary | ICD-10-CM | POA: Insufficient documentation

## 2015-05-13 DIAGNOSIS — S032XXA Dislocation of tooth, initial encounter: Secondary | ICD-10-CM

## 2015-05-13 DIAGNOSIS — M109 Gout, unspecified: Secondary | ICD-10-CM | POA: Insufficient documentation

## 2015-05-13 DIAGNOSIS — Z79899 Other long term (current) drug therapy: Secondary | ICD-10-CM | POA: Insufficient documentation

## 2015-05-13 DIAGNOSIS — R630 Anorexia: Secondary | ICD-10-CM | POA: Insufficient documentation

## 2015-05-13 DIAGNOSIS — R0981 Nasal congestion: Secondary | ICD-10-CM | POA: Insufficient documentation

## 2015-05-13 DIAGNOSIS — J3489 Other specified disorders of nose and nasal sinuses: Secondary | ICD-10-CM | POA: Insufficient documentation

## 2015-05-13 DIAGNOSIS — Y9389 Activity, other specified: Secondary | ICD-10-CM | POA: Insufficient documentation

## 2015-05-13 DIAGNOSIS — R05 Cough: Secondary | ICD-10-CM | POA: Insufficient documentation

## 2015-05-13 DIAGNOSIS — K002 Abnormalities of size and form of teeth: Secondary | ICD-10-CM | POA: Insufficient documentation

## 2015-05-13 DIAGNOSIS — K029 Dental caries, unspecified: Secondary | ICD-10-CM

## 2015-05-13 DIAGNOSIS — Y9289 Other specified places as the place of occurrence of the external cause: Secondary | ICD-10-CM | POA: Insufficient documentation

## 2015-05-13 DIAGNOSIS — R0602 Shortness of breath: Secondary | ICD-10-CM | POA: Insufficient documentation

## 2015-05-13 DIAGNOSIS — H9202 Otalgia, left ear: Secondary | ICD-10-CM | POA: Insufficient documentation

## 2015-05-13 MED ORDER — HYDROCODONE-ACETAMINOPHEN 5-325 MG PO TABS: 2.0000 | ORAL_TABLET | ORAL | Status: DC | PRN

## 2015-05-13 MED ORDER — PENICILLIN V POTASSIUM 500 MG PO TABS: 500.0000 mg | ORAL_TABLET | Freq: Four times a day (QID) | ORAL | Status: AC

## 2015-05-13 NOTE — ED Notes (Signed)
Pt. Left with all belongings and refused wheelchair. Discharge instructions were reviewed and all questions were answered.  

## 2015-05-13 NOTE — Discharge Instructions (Signed)
Dental Caries Dental caries is tooth decay. This decay can cause a hole in teeth (cavity) that can get bigger and deeper over time. HOME CARE  Brush and floss your teeth. Do this at least two times a day.  Use a fluoride toothpaste.  Use a mouth rinse if told by your dentist or doctor.  Eat less sugary and starchy foods. Drink less sugary drinks.  Avoid snacking often on sugary and starchy foods. Avoid sipping often on sugary drinks.  Keep regular checkups and cleanings with your dentist.  Use fluoride supplements if told by your dentist or doctor.  Allow fluoride to be applied to teeth if told by your dentist or doctor.   This information is not intended to replace advice given to you by your health care provider. Make sure you discuss any questions you have with your health care provider.   Document Released: 02/03/2008 Document Revised: 05/17/2014 Document Reviewed: 04/28/2012 Elsevier Interactive Patient Education 2016 Elsevier Inc.  Dental Extraction, Care After Refer to this sheet in the next few weeks. These instructions provide you with information about caring for yourself after your procedure. Your health care provider may also give you more specific instructions. Your treatment has been planned according to current medical practices, but problems sometimes occur. Call your health care provider if you have any problems or questions after your procedure. WHAT TO EXPECT AFTER THE PROCEDURE After your procedure, it is common to have:   Pain and swelling for a few days.  Numbness for a few hours after the procedure. HOME CARE INSTRUCTIONS Lifestyle  Protect the area where your tooth was extracted, even if there is no pain.  Do not smoke, do not spit, and do not drink through a straw until your health care provider says it is okay.  Eat soft-textured foodsas directed by your health care provider. Avoid hot drinks and spicy foods until your gum has healed. Incision  Care  Follow instructions from your health care provider about:  Incision care.  Gauze changes and removal.  Incision closure removal.  Do not chew on the gauze.  If you have heavy bleeding from your gum, fold a clean piece of gauze, place it on the bleeding gum, and bite on it as directed by your health care provider. General Instructions  Take medicines only as directed by your health care provider.  Do not rinse your mouth until your health care provider says it is okay. Once you are told that you can rinse your mouth, do not rinse with a lot of force (vigorously). Doing that can break up the needed clot that forms where your tooth was extracted.  You may rinse your mouth with warm salt water after your health care provider says it is okay. You can make a salt rinse by mixing one teaspoon of salt in two cups of warm water. Do this as directed by your health care provider.  Do not brush or floss near the tooth socket where your tooth was extracted until your health care provider says it is okay. You may brush your other teeth.  If directed, apply ice to your cheek on the affected side of your mouth:  Put ice in a plastic bag.  Place a towel between your skin and the bag.  Leave the ice on for 20 minutes, 2-3 times a day.  Keep all follow-up visits as directed by your health care provider. This is important. SEEK MEDICAL CARE IF:  Your pain is not controlled with  medicines.  You have a fever with nausea, vomiting, or chills.  You have a severe cough or shortness of breath. SEEK IMMEDIATE MEDICAL CARE IF:  You have uncontrolled bleeding, increased swelling, or severe pain.  You have fluid, blood, or pus coming from the gum where your tooth was extracted.  You have difficulty swallowing.  You cannot open your mouth.   This information is not intended to replace advice given to you by your health care provider. Make sure you discuss any questions you have with your  health care provider.    Emergency Department Resource Guide 1) Find a Doctor and Pay Out of Pocket Although you won't have to find out who is covered by your insurance plan, it is a good idea to ask around and get recommendations. You will then need to call the office and see if the doctor you have chosen will accept you as a new patient and what types of options they offer for patients who are self-pay. Some doctors offer discounts or will set up payment plans for their patients who do not have insurance, but you will need to ask so you aren't surprised when you get to your appointment.  2) Contact Your Local Health Department Not all health departments have doctors that can see patients for sick visits, but many do, so it is worth a call to see if yours does. If you don't know where your local health department is, you can check in your phone book. The CDC also has a tool to help you locate your state's health department, and many state websites also have listings of all of their local health departments.  3) Find a Walk-in Clinic If your illness is not likely to be very severe or complicated, you may want to try a walk in clinic. These are popping up all over the country in pharmacies, drugstores, and shopping centers. They're usually staffed by nurse practitioners or physician assistants that have been trained to treat common illnesses and complaints. They're usually fairly quick and inexpensive. However, if you have serious medical issues or chronic medical problems, these are probably not your best option.  No Primary Care Doctor: - Call Health Connect at  [phone] - they can help you locate a primary care doctor that  accepts your insurance, provides certain services, etc. - Physician Referral Service- [phone]  Chronic Pain Problems: Organization         Address  Phone   Notes  [facility]  [phone] Patients need to be referred by their primary care doctor.    Medication Assistance: Organization         Address  Phone   Notes  [facility] Medication [facility] [address] [place], [place] [address] [phone] --Must be a resident of [facility] -- Must have NO insurance coverage whatsoever (no Medicaid/ Medicare, etc.) -- The pt. MUST have a primary care doctor that directs their care regularly and follows them in the community   MedAssist  [phone]   [employer]  [phone]    Agencies that provide inexpensive medical care: Organization         Address  Phone   Notes  [facility]  [phone]   [facility]    [phone]   [facility] [address] [place], [place] [address] [phone]   [facility] [address] [place] [place] [phone])  [phone]   Planned Parenthood    [phone]   [facility]    [phone]   Community [facility] and [facility]  [address], [place] Phone:  [phone], Fax:  [phone] Hours of Operation:  9 am - 6 pm, M-F.  Also accepts Medicaid/Medicare and self-pay.  [facility]  [address], [place] Phone: [phone], Fax: [phone]. Hours of Operation:  8:30 am - 5:30 pm, M-F.  Also accepts Medicaid and self-pay.  [facility] [place] [address] [place] Phone: [phone]   [facility] [address] Natasha Bence [place], [place] [phone], Ext. [phone] Mondays & Thursdays: 7-9 AM.  First 15 patients are seen on a first come, first serve basis.    Medicaid-accepting [facility] Providers:  Organization         Address  Phone   Notes  [facility] [address],  [phone] Also accepts self-pay patients.  [facility] [address] Laurell Josephs [place], [place]  [phone]   [facility] [address], [place] [phone]   [facility] [address] [place] [phone]   Renaye Rakers [address], [place]   [phone] Only accepts [place] patients after they have their name applied to their card.   Self-Pay (no insurance) in [facility]:  Organization         Address  Phone   Notes  Sickle Cell Patients, [facility] [address] [place], [place] [phone]   [facility] [address] [place], [place] [phone]   [facility] [place]  [address], [place] [phone]   [facility]/Dr. Osei-Bonsu  [address], [place] or [address], [place] [phone] Phone number for both [place] and [place] locations is the same.  [facility] [address], [place] [phone]   [facility] [address] [place] or [address] [phone]   [facility] [address], [place] [phone], phone; [phone], fax Sees patients 1st and 3rd Saturday of every month.  Must not qualify for public or private insurance (i.e. Medicaid, Medicare, [place] Health Choice, Veterans' Benefits)  Household income should be no more than 200% of the poverty level The clinic cannot treat you if you are pregnant or think you are pregnant  Sexually transmitted diseases are not treated at the clinic.    Dental Care: Organization         Address  Phone  Notes  [facility] Department of [facility] [address] [place], [place] [phone] Accepts children up to age 72 who are enrolled in [place] or [place] Health Choice; pregnant women with a Medicaid card; and children who have applied for Medicaid or [place] Health Choice, but were declined, whose parents can pay a reduced fee at time of service.    [facility] Department of [facility]  [address], [place] [phone] Accepts children up to age 85 who are enrolled in [place] or [place] Health Choice; pregnant women with a Medicaid card; and children who have applied for Medicaid or [place] Health  Choice, but were declined, whose parents can pay a reduced fee at time of service.  [facility]  [address], [place] [phone]) [phone] Patients are seen by appointment only. Walk-ins are not accepted. [facility] will see patients 36 years of age and older. Monday - Tuesday (8am-5pm) Most Wednesdays (8:30-5pm) $30 per visit, cash only  [facility]  [address], [place] [facility] [place] [place] [phone]) [phone] Patients are seen by appointment only. Walk-ins are not accepted. [facility] will see patients 36 years of age and older. One Wednesday Evening (Monthly: Volunteer Based).  $30 per visit, cash only  [employer] [facility]  [phone]) [phone] for adults; Children under age 744, call Graduate Pediatric Dentistry at [phone]. Children aged 334-14, please call [phone]) [phone] to request a pediatric application.  Dental services are provided in all areas of dental care including fillings, crowns and bridges, complete and partial dentures, implants, gum treatment, root canals, and extractions. Preventive care is also provided. Treatment is provided to both adults and children. Patients are selected via a lottery and there is often a waiting list.   [facility] [address], [place]  [phone]) [phone] [email or web address]   [facility] [address], [place], [place] [phone], Ext. [phone] Second and Fourth Thursday of each month, opens at 6:30 AM; Clinic ends at 9 AM.  Patients are seen on a first-come first-served basis, and a limited number are seen during each clinic.   [facility]  [address], [place], [place] [phone])  [phone]   Eligibility Requirements You must have lived in [place], [place], or [place] counties for at least the last three months.   You cannot be eligible for state or federal sponsored [place] Cityhealthcare insurance, including CIGNAVeterans Administration, [place]icaid, or [employer]icare.   You generally cannot be eligible for healthcare insurance through your employer.    How to apply: Eligibility screenings are held every Tuesday and Wednesday afternoon from 1:00 pm until 4:00 pm. You do not need an appointment for the interview!  [facility] [address], [place], [place] [phone]   [facility]  [phone]   [facility]  [phone]   [facility] [place] [facility]  [phone]    Behavioral Health Resources in the Community: Intensive Outpatient Programs Organization         Address  Phone  Notes  [facility] [address], [place], [place] [phone]   [facility] [address], [place], [place] [phone]   [facility] [address], [place], [place]  [phone]   [facility] [address],  [place], [place] [phone] or [phone]   Substance Abuse Resources Organization         Address  Phone  Notes  [facility]  [phone]   [facility]  [phone]   [facility]  [phone]   [facility]  [phone]   Residential & Outpatient Substance Abuse Program  [phone]   Psychological Services Organization         Address  Phone  Notes  [facility]  [phone]- [phone]   [facility]  [phone]- [phone]   [facility] [address], [place] [phone] or [phone]    Mobile Crisis Teams Organization         Address  Phone  Notes  [facility], Mobile Crisis Care Unit  [phone]   [facility]  [address] [place], [place] [phone]   [facility] [address] [place] [phone]    Self-Help/Support Groups Organization         Address  Phone             Notes  [facility] [place] - variety of support groups  336- I7437963 Call for more information  Narcotics Anonymous (NA), [facility] [address], [employer] [place]  2 meetings at this location   Statistician         Address  Phone  Notes  [facility] [address],    [place]  [phone]   [facility]  [address] [place] [address], [place], [place] [phone]   [facility] [address] [place], [place] [phone] Admissions: 8am-3pm M-F  [facility] [address],    [place], [place] [phone]   [facility] [address] [place], [place], [place] [phone]   [facility] [address],  [place], [place] [phone]   [facility] - Intensive Outpatient [address] Laure[address] Josephs [address], [place], [place] [phone]   [facility] [address]  [place], [place] [phone] or [phone]   [facility] ([facility]) [address], [place], [place] [phone] Accepts Medicaid  [facility] [address],  [place] [phone] Substance Abuse/Addiction Treatment   [facility] Organization         Address  Phone  Notes  [facility]  [phone]   Angie Fava, PhD [address] Ervin Knack [place], [place]   [phone] or [phone]   [facility]   [address] [place], [place] [phone]   [facility] [address] [place], [place] [phone] Insurance/Medicaid/sponsorship through [facility] and Families [address]                                    [place], [place] [phone]  Therapy/tele-psych/case  [facility] [address], [place] [phone]    Dr. Lolly Mustache  [phone]   [facility]  [employer] [facility] Dept. 1) [address], [place] 2) [address], [place] 3)  [address], [place] [phone]  [phone]   [facility] Child Abuse Hotline ([phone] or [phone] (After Hours)      Follow up with a dentist as soon as possible for re-evaluation. Take antibiotics as prescribed. Return to the ED if you experience severe increase in your pain, difficulty swallowing, difficulty breathing, facial swelling, fever.

## 2015-05-13 NOTE — ED Provider Notes (Signed)
CSN: [id number]     Arrival date & time 05/13/15  1721 History  By signing my name below, I, [facility], attest that this documentation has been prepared under the direction and in the presence of AvayaSamantha Dowless, PA-C. Electronically Signed: Randell PatientMarrissa Washington, ED Scribe. 05/13/2015. 5:52 PM.   Chief Complaint  Patient presents with  . Nasal Congestion  . Dental Pain   The history is provided by the patient. No language interpreter was used.   HPI Comments: Randall Stanley is a 35 y.o. male with hx of gout with no recent flare-ups who presents to the Emergency Department complaining of constant, mild, gradually improving nasal congestion 3 days ago and constant, mild, unchanging upper left dental pain onset 30 minutes ago PTA. Patient reports that he has had a cold for the past 3 days that caused him to become dizzy, misstep and fall, striking his mouth on a side table and breaking two of his teeth on the upper left. He endorses associated rhinorrhea with yellow discharge, SOB, cough productive of dark phelgm, and left ear pain. He states that he has been drinking normally and eating less. He has taken Mucinex with some relief and denies taking ibuprofen. Per patient, he has had recent sick contact with his children who have similar symptoms and he notes that he removed a cavity from the same tooth several months but that no pain was present until today. Patient reports that he has been unable to see a dentist due to a lack of insurance. Patient denies being on medication or any other chronic conditions. He denies fever, LOC, vomiting, diarrhea and dizziness since previous episode PTA.  Past Medical History  Diagnosis Date  . Gout    History reviewed. No pertinent past surgical history. History reviewed. No pertinent family history. Social History  Substance Use Topics  . Smoking status: Never Smoker   . Smokeless tobacco: Never Used  . Alcohol Use: Yes     Comment: occasional     Review of Systems  Constitutional: Negative for fever.  HENT: Positive for dental problem (Left upper), ear pain (Left ear) and rhinorrhea (Yellow discharge).   Respiratory: Positive for cough (Productive dark phelgm) and shortness of breath.   Gastrointestinal: Negative for vomiting and diarrhea.  Neurological: Positive for dizziness (Resolved). Negative for syncope.  All other systems reviewed and are negative.     Allergies  Review of patient's allergies indicates no known allergies.  Home Medications   Prior to Admission medications   Medication Sig Start Date End Date Taking? Authorizing Provider  cyclobenzaprine (FLEXERIL) 10 MG tablet Take 1 tablet (10 mg total) by mouth 2 (two) times daily as needed for muscle spasms. 04/02/14   Erskine Emeryhris Davis, MD  DiphenhydrAMINE HCl (ALLERGY MEDICATION PO) Take 1 tablet by mouth daily as needed (for allergies).    Historical Provider, MD  HYDROcodone-acetaminophen (NORCO/VICODIN) 5-325 MG per tablet Take 1-2 tablets by mouth every 4 (four) hours as needed. 08/15/14   Junius FinnerErin O'Malley, PA-C  HYDROcodone-acetaminophen (NORCO/VICODIN) 5-325 MG tablet Take 2 tablets by mouth every 4 (four) hours as needed. 05/13/15   Samantha Tripp Dowless, PA-C  ibuprofen (ADVIL,MOTRIN) 600 MG tablet Take 1 tablet (600 mg total) by mouth every 6 (six) hours as needed. 08/15/14   Junius FinnerErin O'Malley, PA-C  indomethacin (INDOCIN) 50 MG capsule Take 50 mg by mouth daily as needed (for gout).     Historical Provider, MD  neomycin-polymyxin-hydrocortisone (CORTISPORIN) 3.5-10000-1 ophthalmic suspension Place 1 drop into the left  eye daily as needed (for eye infection).  03/23/14   Historical Provider, MD  oxyCODONE-acetaminophen (PERCOCET/ROXICET) 5-325 MG per tablet Take 1-2 tablets by mouth every 4 (four) hours as needed for moderate pain or severe pain. 04/02/14   Erskine Emery, MD  penicillin v potassium (VEETID) 500 MG tablet Take 1 tablet (500 mg total) by mouth 4 (four) times  daily. 05/13/15 05/20/15  Samantha Tripp Dowless, PA-C  Pseudoephedrine HCl (SINUS DECONGESTANT PO) Take 1 tablet by mouth daily as needed (for sinuses).    Historical Provider, MD  sulfamethoxazole-trimethoprim (BACTRIM DS,SEPTRA DS) 800-160 MG per tablet Take 1 tablet by mouth 2 (two) times daily. 12/15/14   Jenise V Bacon Menshew, PA-C  traMADol (ULTRAM) 50 MG tablet Take 50 mg by mouth every 6 (six) hours as needed for moderate pain.     Historical Provider, MD   BP 123/87 mmHg  Pulse 100  Temp(Src) 98.2 F (36.8 C) (Oral)  Resp 18  SpO2 98% Physical Exam  Constitutional: He is oriented to person, place, and time. He appears well-developed and well-nourished. No distress.  HENT:  Head: Normocephalic and atraumatic.  Mouth/Throat: Uvula is midline, oropharynx is clear and moist and mucous membranes are normal. No oral lesions. No trismus in the jaw. Abnormal dentition. Dental caries present. No dental abscesses, uvula swelling or lacerations. No tonsillar abscesses.    Eyes: Conjunctivae are normal. Right eye exhibits no discharge. Left eye exhibits no discharge. No scleral icterus.  Neck: Neck supple.  Cardiovascular: Normal rate and normal heart sounds.   Pulmonary/Chest: Effort normal and breath sounds normal.  Neurological: He is alert and oriented to person, place, and time. Coordination normal.  Skin: Skin is warm and dry. No rash noted. He is not diaphoretic. No erythema. No pallor.  Psychiatric: He has a normal mood and affect. His behavior is normal.  Nursing note and vitals reviewed.   ED Course  Procedures   DIAGNOSTIC STUDIES: Oxygen Saturation is 98% on RA, normal by my interpretation.    COORDINATION OF CARE: 5:34 PM Will prescribe ibuprofen and antibiotics. Advised pt follow-up with dentist. Will provide pt with referral list of dentists. Discussed treatment plan with pt at bedside and pt agreed to plan.   MDM   Final diagnoses:  Dental caries  Tooth avulsion,  initial encounter    Patient with dentalgia.  No abscess requiring immediate incision and drainage.  Exam not concerning for Ludwig's angina or pharyngeal abscess.  Will treat with PCN. Pt instructed to follow-up with dentist.  Discussed return precautions. Pt safe for discharge. Resource guide given.   I personally performed the services described in this documentation, which was scribed in my presence. The recorded information has been reviewed and is accurate.    Dub Mikes, PA-C 05/15/15 [address] [place], PA-C 05/15/15 2235  Mancel Bale, MD 05/16/15 1027

## 2015-05-13 NOTE — ED Notes (Signed)
Pt has a cold that he got from his kids for the last 3 days. Pt c/o an ear ache in left ear. Coughing up clear mucus. Pt. States he stood up and fell over and broke 2 teeth.

## 2015-09-05 ENCOUNTER — Encounter: Payer: Self-pay | Admitting: Primary Care

## 2015-09-05 ENCOUNTER — Ambulatory Visit (INDEPENDENT_AMBULATORY_CARE_PROVIDER_SITE_OTHER): Payer: BLUE CROSS/BLUE SHIELD | Admitting: Primary Care

## 2015-09-05 VITALS — BP 122/82 | HR 65 | Temp 98.2°F | Ht 68.75 in | Wt 232.8 lb

## 2015-09-05 DIAGNOSIS — M109 Gout, unspecified: Secondary | ICD-10-CM | POA: Diagnosis not present

## 2015-09-05 DIAGNOSIS — K219 Gastro-esophageal reflux disease without esophagitis: Secondary | ICD-10-CM | POA: Insufficient documentation

## 2015-09-05 NOTE — Patient Instructions (Signed)
It's important to avoid the chemical that causes your lip to quiver as it is likely the cause.  Please notify me if you have another gout flare.  Please schedule a physical with me within the next 3 months. You may also schedule a lab only appointment 3-4 days prior. We will discuss your lab results in detail during your physical.  It was a pleasure to meet you today! Please don't hesitate to call me with any questions. Welcome to [facility]!  Food Choices for Gastroesophageal Reflux Disease, Adult When you have gastroesophageal reflux disease (GERD), the foods you eat and your eating habits are very important. Choosing the right foods can help ease the discomfort of GERD. WHAT GENERAL GUIDELINES DO I NEED TO FOLLOW?  Choose fruits, vegetables, whole grains, low-fat dairy products, and low-fat meat, fish, and poultry.  Limit fats such as oils, salad dressings, butter, nuts, and avocado.  Keep a food diary to identify foods that cause symptoms.  Avoid foods that cause reflux. These may be different for different people.  Eat frequent small meals instead of three large meals each day.  Eat your meals slowly, in a relaxed setting.  Limit fried foods.  Cook foods using methods other than frying.  Avoid drinking alcohol.  Avoid drinking large amounts of liquids with your meals.  Avoid bending over or lying down until 2-3 hours after eating. WHAT FOODS ARE NOT RECOMMENDED? The following are some foods and drinks that may worsen your symptoms: Vegetables Tomatoes. Tomato juice. Tomato and spaghetti sauce. Chili peppers. Onion and garlic. Horseradish. Fruits Oranges, grapefruit, and lemon (fruit and juice). Meats High-fat meats, fish, and poultry. This includes hot dogs, ribs, ham, sausage, salami, and bacon. Dairy Whole milk and chocolate milk. Sour cream. Cream. Butter. Ice cream. Cream cheese.  Beverages Coffee and tea, with or without caffeine. Carbonated beverages or energy  drinks. Condiments Hot sauce. Barbecue sauce.  Sweets/Desserts Chocolate and cocoa. Donuts. Peppermint and spearmint. Fats and Oils High-fat foods, including Jamaica[place] fries and potato chips. Other Vinegar. Strong spices, such as black pepper, white pepper, red pepper, cayenne, curry powder, cloves, ginger, and chili powder. The items listed above may not be a complete list of foods and beverages to avoid. Contact your dietitian for more information.   This information is not intended to replace advice given to you by your health care provider. Make sure you discuss any questions you have with your health care provider.   Document Released: 04/26/2005 Document Revised: 05/17/2014 Document Reviewed: 02/28/2013 Elsevier Interactive Patient Education [employer]2016 Elsevier Inc.

## 2015-09-05 NOTE — Assessment & Plan Note (Addendum)
Intermittent, typically with spicy foods. Improved with almond milk. Does not take OTC meds. Handout provided today regarding triggers.

## 2015-09-05 NOTE — Progress Notes (Signed)
Pre visit review using our clinic review tool, if applicable. No additional management support is needed unless otherwise documented below in the visit note. 

## 2015-09-05 NOTE — Progress Notes (Signed)
Subjective:    Patient ID: Randall Stanley, male    DOB: 1981/02/17, 35 y.o.   MRN: [id number]  HPI  [name] is a 35 year old male who presents today to establish care and discuss the problems mentioned below. Will obtain old records. His last physical was years ago.   1) GERD: intermittently for years, mostly after eating spicy foods. He will drink almond milk with improvement.   2) Gout: Diagnosed 2 years ago. Flare up origionally to right great toe. He's had an additional flare up to his pinky (1 year ago). He's avoiding trigger foods and has not had flare since.   3) Asthma: Diagnosed as a child, does not have problems as an adult.   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 Noticed quivering to his lower lip since January 2017. His wife will notice this several days of the week, only after he's worked with a certain powder chemical. He does wear a respirator. Family history of parkinson's in maternal grandmother. Denies chest pain, dizziness, numbness, resting tremor to hands or other extremities, facial numbness.   Review of Systems  Constitutional: Negative for unexpected weight change.  HENT: Negative for congestion.   Respiratory: Negative for shortness of breath.   Cardiovascular: Negative for chest pain.  Musculoskeletal: Negative for myalgias and arthralgias.  Neurological: Negative for dizziness, light-headedness and headaches.       Lower lip tremor, occasional       Past Medical History  Diagnosis Date  . Gout   . Kidney stones   . Asthma   . Chicken pox   . Scarlet fever      Social History   Social History  . Marital Status: Married    Spouse Name: N/A  . Number of Children: N/A  . Years of Education: N/A   Occupational History  . Not on file.   Social History Main Topics  . Smoking status: Never Smoker   . Smokeless tobacco: Never Used  . Alcohol Use: 0.0 oz/week    0 Standard drinks or equivalent per week   Comment: occasional  . Drug Use: No  . Sexual Activity: Not on file   Other Topics Concern  . Not on file   Social History Narrative   Married.   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   Enjoys gardening    No past surgical history on file.  Family History  Problem Relation Age of Onset  . Parkinson's disease Maternal Grandmother   . Hyperlipidemia Father   . Stroke Father   . Rheumatic fever Father   . Heart attack Father   . Kidney failure Father   . Diabetes Father   . COPD Mother     No Known Allergies  No current outpatient prescriptions on file prior to visit.   No current facility-administered medications on file prior to visit.    BP 122/82 mmHg  Pulse 65  Temp(Src) 98.2 F (36.8 C) (Oral)  Ht 5' 8.75" (1.746 m)  Wt 232 lb 12.8 oz (105.597 kg)  BMI 34.64 kg/m2  SpO2 98%    Objective:   Physical Exam  Constitutional: He is oriented to person, place, and time. He appears well-nourished.  HENT:  No quivering of lip noted during exam. He is off work today.  Eyes: EOM are normal. Pupils are equal, round, and reactive to light.  Neck: Neck supple.  Cardiovascular: Normal rate and regular rhythm.  Pulmonary/Chest: Effort normal and breath sounds normal. He has no wheezes. He has no rales.  Neurological: He is alert and oriented to person, place, and time. No cranial nerve deficit.  Skin: Skin is warm and dry.  Psychiatric: He has a normal mood and affect.          Assessment & Plan:  Lip Tremor:  Noted in January 2017. Works for [employer].  Lip quivers only after working with specific type of chemical. Discussed to avoid this chemical if possible. He plans to switch positions. Lungs clear, neuro exam unremarkable. No resting tremor otherwise noted. Follow up PRN.

## 2015-09-05 NOTE — Assessment & Plan Note (Signed)
Intermittent. No recent flare in 1 year.

## 2015-11-06 ENCOUNTER — Encounter: Payer: Self-pay | Admitting: Primary Care

## 2015-11-06 ENCOUNTER — Ambulatory Visit (INDEPENDENT_AMBULATORY_CARE_PROVIDER_SITE_OTHER): Payer: BLUE CROSS/BLUE SHIELD | Admitting: Primary Care

## 2015-11-06 VITALS — BP 122/80 | HR 71 | Temp 98.3°F | Ht 68.75 in | Wt 229.4 lb

## 2015-11-06 DIAGNOSIS — R112 Nausea with vomiting, unspecified: Secondary | ICD-10-CM

## 2015-11-06 MED ORDER — ONDANSETRON 4 MG PO TBDP: 4.0000 mg | ORAL_TABLET | Freq: Three times a day (TID) | ORAL | Status: DC | PRN

## 2015-11-06 NOTE — Progress Notes (Signed)
Pre visit review using our clinic review tool, if applicable. No additional management support is needed unless otherwise documented below in the visit note. 

## 2015-11-06 NOTE — Patient Instructions (Signed)
Your symptoms are related to a viral infection that will pass on its own.  You may take Zofran (ondansetron) tablets as needed. Melt 1 tablet in your mouth every 8 hours as needed for nausea and vomiting.  Continue to stay hydrated with water. Slowly advance your diet as tolerated.  Please notify me if you develop fevers, diarrhea, increased abdominal pain, and/or if symptoms do not improve.  It was a pleasure to see you today!

## 2015-11-06 NOTE — Progress Notes (Signed)
Subjective:    Patient ID: Randall Stanley, male    DOB: 01/31/1981, 35 y.o.   MRN: [id number]  HPI  [name] is a 35 year old male who presents today with a chief complaint of abdominal pain. He also reports nausea, vomiting, and fatigue. His abdominal pain is generalized. His symptoms have been present since since Friday last week. Denies diarrhea, fevers. Everyone in his household has the same symptoms and are gradually improving.   He's able to tolerate solids and liquids without vomiting for the most part. He ate dinner last night with some nausea, no vomiting. His last episode of vomiting was this morning which was minimal. He's not taken anything OTC for his symptoms. He has had to miss work due to his symptoms.  Review of Systems  Constitutional: Positive for fatigue. Negative for fever and chills.  Gastrointestinal: Positive for nausea, vomiting and abdominal pain. Negative for diarrhea and constipation.  Neurological: Positive for weakness. Negative for dizziness.       Past Medical History  Diagnosis Date  . Gout   . Kidney stones   . Asthma   . Chicken pox   . Scarlet fever      Social History   Social History  . Marital Status: Married    Spouse Name: N/A  . Number of Children: N/A  . Years of Education: N/A   Occupational History  . Not on file.   Social History Main Topics  . Smoking status: Never Smoker   . Smokeless tobacco: Never Used  . Alcohol Use: 0.0 oz/week    0 Standard drinks or equivalent per week     Comment: occasional  . Drug Use: No  . Sexual Activity: Not on file   Other Topics Concern  . Not on file   Social History Narrative   Married.   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   Enjoys gardening    No past surgical history on file.  Family History  Problem Relation Age of Onset  . Parkinson's disease Maternal Grandmother   . Hyperlipidemia Father   . Stroke Father   . Rheumatic fever Father   . Heart attack  Father   . Kidney failure Father   . Diabetes Father   . COPD Mother     No Known Allergies  No current outpatient prescriptions on file prior to visit.   No current facility-administered medications on file prior to visit.    BP 122/80 mmHg  Pulse 71  Temp(Src) 98.3 F (36.8 C) (Oral)  Ht 5' 8.75" (1.746 m)  Wt 229 lb 6.4 oz (104.055 kg)  BMI 34.13 kg/m2  SpO2 98%    Objective:   Physical Exam  Constitutional: He appears well-nourished.  Neck: Neck supple.  Cardiovascular: Normal rate and regular rhythm.   Pulmonary/Chest: Effort normal and breath sounds normal.  Abdominal: Soft. Bowel sounds are normal. There is generalized tenderness. There is no guarding, no tenderness at McBurney's point and negative Murphy's sign.  Skin: Skin is warm and dry.          Assessment & Plan:  Viral gastritis:  Abdominal pain, nausea, vomiting, fatigue for 6 days. Everyone in household with same symptoms. Family improving. Overall his vomiting has reduced but continues to experience nausea. Exam today suggestive of viral gastritis. Vital signs stable and do not suggest dehydration, appears ill but not toxic. Abdominal exam with generalized tenderness otherwise unremarkable. Will treat with conservative measures including prescription  for Zofran, oral hydration, advance diet as tolerated. Work note provided for him to stay out through the weekend in order to rest. Return precautions provided.  Morrie Sheldonlark,Tobiah Celestine Kendal, NP

## 2015-11-20 ENCOUNTER — Other Ambulatory Visit: Payer: Self-pay | Admitting: Primary Care

## 2015-11-20 DIAGNOSIS — M1A9XX0 Chronic gout, unspecified, without tophus (tophi): Secondary | ICD-10-CM

## 2015-11-20 DIAGNOSIS — Z0000 Encounter for general adult medical examination without abnormal findings: Secondary | ICD-10-CM

## 2015-11-28 ENCOUNTER — Other Ambulatory Visit: Payer: BLUE CROSS/BLUE SHIELD

## 2015-11-28 ENCOUNTER — Other Ambulatory Visit (INDEPENDENT_AMBULATORY_CARE_PROVIDER_SITE_OTHER): Payer: BLUE CROSS/BLUE SHIELD

## 2015-11-28 DIAGNOSIS — M1A9XX0 Chronic gout, unspecified, without tophus (tophi): Secondary | ICD-10-CM

## 2015-11-28 DIAGNOSIS — Z0000 Encounter for general adult medical examination without abnormal findings: Secondary | ICD-10-CM | POA: Diagnosis not present

## 2015-11-28 LAB — COMPREHENSIVE METABOLIC PANEL
ALBUMIN: 4.4 g/dL (ref 3.5–5.2)
ALK PHOS: 48 U/L (ref 39–117)
ALT: 35 U/L (ref 0–53)
AST: 28 U/L (ref 0–37)
BUN: 12 mg/dL (ref 6–23)
CO2: 30 mEq/L (ref 19–32)
CREATININE: 1.05 mg/dL (ref 0.40–1.50)
Calcium: 9.5 mg/dL (ref 8.4–10.5)
Chloride: 103 mEq/L (ref 96–112)
GFR: 84.88 mL/min (ref >60.00)
GLUCOSE: 90 mg/dL (ref 70–99)
Potassium: 4 mEq/L (ref 3.5–5.1)
SODIUM: 139 meq/L (ref 135–145)
TOTAL PROTEIN: 8 g/dL (ref 6.0–8.3)
Total Bilirubin: 0.6 mg/dL (ref 0.2–1.2)

## 2015-11-28 LAB — LIPID PANEL
Cholesterol: 125 mg/dL (ref 0–200)
HDL: 32.6 mg/dL — AB (ref >39.00)
LDL Cholesterol: 73 mg/dL (ref 0–99)
NONHDL: 92.54
TRIGLYCERIDES: 100 mg/dL (ref 0.0–149.0)
Total CHOL/HDL Ratio: 4
VLDL: 20 mg/dL (ref 0.0–40.0)

## 2015-11-28 LAB — URIC ACID: Uric Acid, Serum: 7.9 mg/dL — ABNORMAL HIGH (ref 4.0–7.8)

## 2015-11-28 LAB — HEMOGLOBIN A1C: HEMOGLOBIN A1C: 5.8 % (ref 4.6–6.5)

## 2015-12-05 ENCOUNTER — Encounter: Payer: Self-pay | Admitting: Primary Care

## 2015-12-11 ENCOUNTER — Ambulatory Visit (INDEPENDENT_AMBULATORY_CARE_PROVIDER_SITE_OTHER): Payer: BLUE CROSS/BLUE SHIELD | Admitting: Primary Care

## 2015-12-11 ENCOUNTER — Encounter: Payer: Self-pay | Admitting: Primary Care

## 2015-12-11 VITALS — BP 126/84 | HR 66 | Temp 98.0°F | Ht 69.0 in | Wt 228.1 lb

## 2015-12-11 DIAGNOSIS — M109 Gout, unspecified: Secondary | ICD-10-CM

## 2015-12-11 DIAGNOSIS — Z0000 Encounter for general adult medical examination without abnormal findings: Secondary | ICD-10-CM | POA: Insufficient documentation

## 2015-12-11 NOTE — Assessment & Plan Note (Signed)
Immunizations UTD. He is working to improve his diet by increasing vegetables and water. He's also growing his own vegetables. Discussed the importance of a healthy diet and regular exercise in order for weight loss and to reduce risk of other medical diseases. Exam unremarkable. Labs stable.  Follow up in 1 year for repeat physical.

## 2015-12-11 NOTE — Progress Notes (Signed)
Pre visit review using our clinic review tool, if applicable. No additional management support is needed unless otherwise documented below in the visit note. 

## 2015-12-11 NOTE — Progress Notes (Signed)
Subjective:    Patient ID: Randall Stanley, male    DOB: 09-03-1980, 35 y.o.   MRN: [id number]  HPI  Randall Stanley is a 35 year old male who presents today for complete physical.  Immunizations: -Tetanus: Completed in 2011 -Influenza: Did not complete last season  Diet: He endorses a healthy diet Breakfast: Skips Lunch: Tomato sandwich, fruit Dinner: Meat, rice, corn, potatoes Snacks: None Desserts: None Beverages: Water, coffee, soda  Exercise: He does not currently exercise Eye exam: Completed several years ago, no acute changes in vision Dental exam: Has not seen in years   Review of Systems  Constitutional: Negative for unexpected weight change.  HENT: Negative for rhinorrhea.   Respiratory: Negative for cough and shortness of breath.   Cardiovascular: Negative for chest pain.  Gastrointestinal: Negative for constipation and diarrhea.  Genitourinary: Negative for difficulty urinating.  Musculoskeletal: Negative for arthralgias and myalgias.  Skin: Negative for rash.  Allergic/Immunologic: Negative for environmental allergies.  Neurological: Negative for dizziness, numbness and headaches.       Occasional dizziness   Psychiatric/Behavioral:       Denies concerns for anxiety or depression       Past Medical History:  Diagnosis Date  . Asthma   . Chicken pox   . Gout   . Kidney stones   . Scarlet fever      Social History   Social History  . Marital status: Married    Spouse name: N/A  . Number of children: N/A  . Years of education: N/A   Occupational History  . Not on file.   Social History Main Topics  . Smoking status: Never Smoker  . Smokeless tobacco: Never Used  . Alcohol use 0.0 oz/week     Comment: occasional  . Drug use: No  . Sexual activity: Not on file   Other Topics Concern  . Not on file   Social History Narrative   Married.   2 children   Works as a Child psychotherapist.   Enjoys gardening    No past surgical history on  file.  Family History  Problem Relation Age of Onset  . Parkinson's disease Maternal Grandmother   . Hyperlipidemia Father   . Stroke Father   . Rheumatic fever Father   . Heart attack Father   . Kidney failure Father   . Diabetes Father   . COPD Mother     No Known Allergies  No current outpatient prescriptions on file prior to visit.   No current facility-administered medications on file prior to visit.     BP 126/84   Pulse 66   Temp 98 F (36.7 C) (Oral)   Ht 5\' 9"  (1.753 m)   Wt 228 lb 1.9 oz (103.5 kg)   SpO2 98%   BMI 33.69 kg/m    Objective:   Physical Exam  Constitutional: He is oriented to person, place, and time. He appears well-nourished.  HENT:  Right Ear: Tympanic membrane and ear canal normal.  Left Ear: Tympanic membrane and ear canal normal.  Nose: Nose normal. Right sinus exhibits no maxillary sinus tenderness and no frontal sinus tenderness. Left sinus exhibits no maxillary sinus tenderness and no frontal sinus tenderness.  Mouth/Throat: Oropharynx is clear and moist.  Eyes: Conjunctivae and EOM are normal. Pupils are equal, round, and reactive to light.  Neck: Neck supple. Carotid bruit is not present. No thyromegaly present.  Cardiovascular: Normal rate, regular rhythm and normal heart sounds.   Pulmonary/Chest:  Effort normal and breath sounds normal. He has no wheezes. He has no rales.  Abdominal: Soft. Bowel sounds are normal. There is no tenderness.  Musculoskeletal: Normal range of motion.  Neurological: He is alert and oriented to person, place, and time. He has normal reflexes. No cranial nerve deficit.  Skin: Skin is warm and dry.  Psychiatric: He has a normal mood and affect.          Assessment & Plan:

## 2015-12-11 NOTE — Assessment & Plan Note (Signed)
No recent flares. Uric acid level of 7.9 on recent labs.

## 2015-12-11 NOTE — Patient Instructions (Signed)
Your labs overall look great!  Continue to reduce consumption of sodas, junk food, fried foods, fast foods. Increase consumption of vegetables, fruit, lean protein, whole grains.  Start exercising. You should be getting 1 hour of moderate intensity exercise 3-4 days weekly.  Ensure you are consuming 64 ounces of water daily.  Follow up in 1 year for repeat physical or sooner if needed.  It was a pleasure to see you today! Best of luck with your new job!

## 2016-01-09 ENCOUNTER — Ambulatory Visit: Payer: Self-pay | Admitting: Family Medicine

## 2016-11-24 ENCOUNTER — Emergency Department
Admission: EM | Admit: 2016-11-24 | Discharge: 2016-11-24 | Disposition: A | Discharge status: Home / Self Care | Payer: Self-pay | Attending: Emergency Medicine | Admitting: Emergency Medicine

## 2016-11-24 ENCOUNTER — Encounter: Payer: Self-pay | Admitting: Emergency Medicine

## 2016-11-24 ENCOUNTER — Emergency Department: Payer: Self-pay

## 2016-11-24 DIAGNOSIS — Z98818 Other dental procedure status: Secondary | ICD-10-CM

## 2016-11-24 DIAGNOSIS — Z48814 Encounter for surgical aftercare following surgery on the teeth or oral cavity: Secondary | ICD-10-CM | POA: Insufficient documentation

## 2016-11-24 LAB — BASIC METABOLIC PANEL
ANION GAP: 7 (ref 5–15)
BUN: 13 mg/dL (ref 6–20)
CHLORIDE: 102 mmol/L (ref 101–111)
CO2: 28 mmol/L (ref 22–32)
Calcium: 9.5 mg/dL (ref 8.9–10.3)
Creatinine, Ser: 0.86 mg/dL (ref 0.61–1.24)
GFR calc Af Amer: >60 mL/min (ref >60)
GFR calc non Af Amer: >60 mL/min (ref >60)
Glucose, Bld: 92 mg/dL (ref 65–99)
POTASSIUM: 4.2 mmol/L (ref 3.5–5.1)
SODIUM: 137 mmol/L (ref 135–145)

## 2016-11-24 LAB — CBC WITH DIFFERENTIAL/PLATELET
BASOS ABS: 0.1 10*3/uL (ref 0–0.1)
Basophils Relative: 1 %
EOS ABS: 0.1 10*3/uL (ref 0–0.7)
Eosinophils Relative: 1 %
HCT: 46 % (ref 40.0–52.0)
HEMOGLOBIN: 16 g/dL (ref 13.0–18.0)
Lymphocytes Relative: 21 %
Lymphs Abs: 2.1 10*3/uL (ref 1.0–3.6)
MCH: 29.8 pg (ref 26.0–34.0)
MCHC: 34.9 g/dL (ref 32.0–36.0)
MCV: 85.6 fL (ref 80.0–100.0)
Monocytes Absolute: 1 10*3/uL (ref 0.2–1.0)
Monocytes Relative: 10 %
NEUTROS PCT: 67 %
Neutro Abs: 6.9 10*3/uL — ABNORMAL HIGH (ref 1.4–6.5)
PLATELETS: 283 10*3/uL (ref 150–440)
RBC: 5.38 MIL/uL (ref 4.40–5.90)
RDW: 12.9 % (ref 11.5–14.5)
WBC: 10.2 10*3/uL (ref 3.8–10.6)

## 2016-11-24 MED ORDER — DIPHENHYDRAMINE HCL 25 MG PO CAPS: 25.0000 mg | ORAL_CAPSULE | ORAL | 0 refills | Status: DC | PRN

## 2016-11-24 MED ORDER — DIPHENHYDRAMINE HCL 50 MG/ML IJ SOLN
25.0000 mg | Freq: Once | INTRAMUSCULAR | Status: AC
  Administered 2016-11-24: 25 mg via INTRAVENOUS
  Filled 2016-11-24: qty 1

## 2016-11-24 MED ORDER — CLINDAMYCIN PHOSPHATE 600 MG/50ML IV SOLN
600.0000 mg | Freq: Once | INTRAVENOUS | Status: AC
  Administered 2016-11-24: 600 mg via INTRAVENOUS
  Filled 2016-11-24: qty 50

## 2016-11-24 MED ORDER — SODIUM CHLORIDE 0.9 % IV BOLUS (SEPSIS)
1000.0000 mL | Freq: Once | INTRAVENOUS | Status: AC
  Administered 2016-11-24: 1000 mL via INTRAVENOUS

## 2016-11-24 MED ORDER — RANITIDINE HCL 150 MG PO TABS: 150.0000 mg | ORAL_TABLET | Freq: Two times a day (BID) | ORAL | 0 refills | Status: DC

## 2016-11-24 MED ORDER — CLINDAMYCIN HCL 300 MG PO CAPS: 300.0000 mg | ORAL_CAPSULE | Freq: Three times a day (TID) | ORAL | 0 refills | Status: AC

## 2016-11-24 MED ORDER — PREDNISONE 10 MG PO TABS: ORAL_TABLET | ORAL | 0 refills | Status: DC

## 2016-11-24 MED ORDER — METHYLPREDNISOLONE SODIUM SUCC 125 MG IJ SOLR
125.0000 mg | Freq: Once | INTRAMUSCULAR | Status: AC
Start: 2016-11-24 — End: 2016-11-24
  Administered 2016-11-24: 125 mg via INTRAVENOUS
  Filled 2016-11-24: qty 2

## 2016-11-24 MED ORDER — LIDOCAINE VISCOUS 2 % MT SOLN: 10.0000 mL | OROMUCOSAL | 0 refills | Status: DC | PRN

## 2016-11-24 MED ORDER — FAMOTIDINE IN NACL 20-0.9 MG/50ML-% IV SOLN
20.0000 mg | Freq: Once | INTRAVENOUS | Status: AC
  Administered 2016-11-24: 20 mg via INTRAVENOUS
  Filled 2016-11-24: qty 50

## 2016-11-24 MED ORDER — IOPAMIDOL (ISOVUE-300) INJECTION 61%
75.0000 mL | Freq: Once | INTRAVENOUS | Status: AC | PRN
  Administered 2016-11-24: 75 mL via INTRAVENOUS
  Filled 2016-11-24: qty 75

## 2016-11-24 NOTE — ED Notes (Signed)
Pt discharged home after verbalizing understanding of discharge instructions; nad noted. 

## 2016-11-24 NOTE — ED Notes (Signed)
Discuss with dr Shaune Pollacklord, labs per her recommendations

## 2016-11-24 NOTE — ED Triage Notes (Addendum)
Pt reports wisdom teeth surgically removed last Thursday and regular molar pulled.  Last 2 days has been having increasing feeling like throat closing and today is worst. When asked if hurts to swallow or can't swallow reports hurts bad but at times feels hard to get food to pass.  Did eat two bites pizza this AM and describes as feels like it was cutting his throat going down.  No respiratory distress. Handling secretions in triage without difficulty.Marland Kitchen. Speaking without difficulty. ambulatory

## 2016-11-24 NOTE — ED Notes (Signed)
Pt presents with swelling and pain to throat and gums on left side. Pt states that he saw his oral surgeon on Monday and was told that everything was healing fine. He states that the swelling has gotten progressively worse and that he finds it difficult to eat or swallow. Pt reports that he can swallow his saliva just fine, but that food is problematic. He was told he could eat normal foods now and is no longer on a soft foods diet. Pt alert & oriented with some swelling noted to his face.

## 2016-11-24 NOTE — ED Notes (Signed)
NAD in lobby, using phone

## 2016-11-24 NOTE — ED Notes (Signed)
First nurse note  Presents with sore throat and swelling  States he had wisdom teeth removed last Thursday  facial swelling noted

## 2016-11-24 NOTE — ED Provider Notes (Signed)
[facility] Emergency Department Provider Note  ____________________________________________  Time seen: Approximately 11:22 AM  I have reviewed the triage vital signs and the nursing notes.   HISTORY  Chief Complaint Sore Throat    HPI Randall Stanley is a 36 y.o. male that presents to the emergency department with a sore throat after getting left wisdom teeth and back molar removed on Thursday. Patient states that he saw the surgeon on Monday and had another procedure. Pain has been constant since Thursday but is slowly getting worse. Pain is sharp when swallowing. He ate pizza this morning.He has been taking amoxicillin since procedure. He denies shortness of breath, chest pain, nausea, vomiting, abdominal pain.   Past Medical History:  Diagnosis Date  . Chicken pox   . Gout   . Kidney stones   . Scarlet fever     Patient Active Problem List   Diagnosis Date Noted  . Preventative health care 12/11/2015  . GERD (gastroesophageal reflux disease) 09/05/2015  . Gout 09/05/2015    History reviewed. No pertinent surgical history.  Prior to Admission medications   Medication Sig Start Date End Date Taking? Authorizing Provider  clindamycin (CLEOCIN) 300 MG capsule Take 1 capsule (300 mg total) by mouth 3 (three) times daily. 11/24/16 12/04/16  Enid Derry, PA-C  diphenhydrAMINE (BENADRYL) 25 mg capsule Take 1 capsule (25 mg total) by mouth every 4 (four) hours as needed. 11/24/16 11/24/17  Enid Derry, PA-C  lidocaine (XYLOCAINE) 2 % solution Use as directed 10 mLs in the mouth or throat as needed for mouth pain. 11/24/16   Enid Derry, PA-C  predniSONE (DELTASONE) 10 MG tablet Take 6 tablets on day 1, take 5 tablets on day 2, take 4 tablets on day 3, take 3 tablets on day 4, take 2 tablets on day 5, take 1 tablet on day 6 11/24/16   Enid Derry, PA-C  ranitidine (ZANTAC) 150 MG tablet Take 1 tablet (150 mg total) by mouth 2 (two) times daily.  11/24/16 11/24/17  Enid Derry, PA-C    Allergies Patient has no known allergies.  Family History  Problem Relation Age of Onset  . Parkinson's disease Maternal Grandmother   . Hyperlipidemia Father   . Stroke Father   . Rheumatic fever Father   . Heart attack Father   . Kidney failure Father   . Diabetes Father   . COPD Mother     Social History Social History  Substance Use Topics  . Smoking status: Never Smoker  . Smokeless tobacco: Never Used  . Alcohol use 0.0 oz/week     Comment: occasional     Review of Systems  Constitutional: No fever/chills Cardiovascular: No chest pain. Respiratory: No SOB. Gastrointestinal: No abdominal pain.  No nausea, no vomiting.  Skin: Negative for rash, abrasions, lacerations, ecchymosis. Neurological: Negative for headaches, numbness or tingling   ____________________________________________   PHYSICAL EXAM:  VITAL SIGNS: ED Triage Vitals  Enc Vitals Group     BP 11/24/16 0847 (!) 133/91     Pulse Rate 11/24/16 0847 76     Resp 11/24/16 0847 18     Temp 11/24/16 0847 98.7 F (37.1 C)     Temp Source 11/24/16 0847 Oral     SpO2 11/24/16 0847 100 %     Weight 11/24/16 0848 232 lb (105.2 kg)     Height 11/24/16 0848 5\' 9"  (1.753 m)     Head Circumference --      Peak Flow --  Pain Score 11/24/16 0849 3     Pain Loc --      Pain Edu? --      Excl. in GC? --      Constitutional: Alert and oriented. Well appearing and in no acute distress. Eyes: Conjunctivae are normal. PERRL. EOMI. Head: Atraumatic. ENT:      Ears:      Nose: No congestion/rhinnorhea.      Mouth/Throat: Mucous membranes are moist. Green discharge present in lower back left molar cavity. No TMJ pain. Swelling of left cheek. Opening and closing mouth normally. Maintaining secretions. Neck: No stridor.   Cardiovascular: Normal rate, regular rhythm.  Good peripheral circulation. Respiratory: Normal respiratory effort without tachypnea or  retractions. Lungs CTAB. Good air entry to the bases with no decreased or absent breath sounds. Musculoskeletal: Full range of motion to all extremities. No gross deformities appreciated. Neurologic:  Normal speech and language. No gross focal neurologic deficits are appreciated.  Skin:  Skin is warm, dry and intact. No rash noted.   ____________________________________________   LABS (all labs ordered are listed, but only abnormal results are displayed)  Labs Reviewed  CBC WITH DIFFERENTIAL/PLATELET - Abnormal; Notable for the following:       Result Value   Neutro Abs 6.9 (*)    All other components within normal limits  BASIC METABOLIC PANEL   ____________________________________________  EKG   ____________________________________________  RADIOLOGY  Ct Soft Tissue Neck W Contrast  Result Date: 11/24/2016 CLINICAL DATA:  Swelling. Dental infection. Recent wisdom tooth removal. EXAM: CT NECK WITH CONTRAST TECHNIQUE: Multidetector CT imaging of the neck was performed using the standard protocol following the bolus administration of intravenous contrast. CONTRAST:  75mL ISOVUE-300 IOPAMIDOL (ISOVUE-300) INJECTION 61% COMPARISON:  None. FINDINGS: Pharynx and larynx: Sequelae of recent left mandibular second and third molar the tooth extractions are identified. A small bone fragment or tooth root fragment is noted in the third molar socket. There is also a small nondisplaced fracture more anteriorly through the medial aspect of the alveolar ridge at the second molar level. There is mild edema in the surrounding soft tissues at this level with edema tracking anteroinferiorly into the left floor of mouth as well as posteriorly into the parapharyngeal space. No organized fluid collection is seen. The airway remains widely patent. There is mild symmetric soft tissue prominence of both palatine tonsils with small calcifications noted. The larynx is unremarkable. Salivary glands: No  inflammation, mass, or stone. Thyroid: Subcentimeter calcified right thyroid nodule. Lymph nodes: No enlarged lymph nodes in the neck. Vascular: Unremarkable. Limited intracranial: Unremarkable. Visualized orbits: Unremarkable. Mastoids and visualized paranasal sinuses: Minimal mucosal thickening/small mucous retention cysts in the maxillary sinuses. Clear mastoid air cells. Skeleton: No suspicious osseous lesion. Upper chest: Clear lung apices. Other: None. IMPRESSION: Postoperative changes from recent left mandibular molar teeth extractions. Mild regional edema extending into the floor of mouth without abscess identified. Electronically Signed   By: Sebastian AcheAllen  Grady M.D.   On: 11/24/2016 12:47    ____________________________________________    PROCEDURES  Procedure(s) performed:    Procedures    Medications  methylPREDNISolone sodium succinate (SOLU-MEDROL) 125 mg/2 mL injection 125 mg (125 mg Intravenous Given 11/24/16 1104)  diphenhydrAMINE (BENADRYL) injection 25 mg (25 mg Intravenous Given 11/24/16 1104)  clindamycin (CLEOCIN) IVPB 600 mg (0 mg Intravenous Stopped 11/24/16 1216)  sodium chloride 0.9 % bolus 1,000 mL (0 mLs Intravenous Stopped 11/24/16 1401)  famotidine (PEPCID) IVPB 20 mg premix (0 mg Intravenous Stopped 11/24/16 1401)  iopamidol (ISOVUE-300) 61 % injection 75 mL (75 mLs Intravenous Contrast Given 11/24/16 1222)     ____________________________________________   INITIAL IMPRESSION / ASSESSMENT AND PLAN / ED COURSE  Pertinent labs & imaging results that were available during my care of the patient were reviewed by me and considered in my medical decision making (see chart for details).  Review of the [place] CSRS was performed in accordance of the [facility] prior to dispensing any controlled drugs.   Patient presented to the emergency department for evaluation of throat pain after wisdom tooth surgery. Vital signs, exam, lab work are reassuring. Green discharge was present in  lower back molar. No acute abnormalities on CT. Patient is able to eat crackers and drink soda in ED. He is not having any shortness of breath. He was given Solu-Medrol, clindamycin, Benadryl, Pepcid in ED. Patient will be discharged home with prescriptions for clindamycin, prednisone, ranitidine, Benadryl. Patient is to follow up with oral surgery as directed. Patient is given ED precautions to return to the ED for any worsening or new symptoms.     ____________________________________________  FINAL CLINICAL IMPRESSION(S) / ED DIAGNOSES  Final diagnoses:  S/P wisdom tooth extraction      NEW MEDICATIONS STARTED DURING THIS VISIT:  Discharge Medication List as of 11/24/2016  1:40 PM    START taking these medications   Details  clindamycin (CLEOCIN) 300 MG capsule Take 1 capsule (300 mg total) by mouth 3 (three) times daily., Starting Wed 11/24/2016, Until Sat 12/04/2016, Print    diphenhydrAMINE (BENADRYL) 25 mg capsule Take 1 capsule (25 mg total) by mouth every 4 (four) hours as needed., Starting Wed 11/24/2016, Until Thu 11/24/2017, Print    lidocaine (XYLOCAINE) 2 % solution Use as directed 10 mLs in the mouth or throat as needed for mouth pain., Starting Wed 11/24/2016, Print    predniSONE (DELTASONE) 10 MG tablet Take 6 tablets on day 1, take 5 tablets on day 2, take 4 tablets on day 3, take 3 tablets on day 4, take 2 tablets on day 5, take 1 tablet on day 6, Print    ranitidine (ZANTAC) 150 MG tablet Take 1 tablet (150 mg total) by mouth 2 (two) times daily., Starting Wed 11/24/2016, Until Thu 11/24/2017, Print            This chart was dictated using voice recognition software/Dragon. Despite best efforts to proofread, errors can occur which can change the meaning. Any change was purely unintentional.    Enid Derry, PA-C 11/25/16 1058    Governor Rooks, MD 11/25/16 [phone]

## 2019-01-17 ENCOUNTER — Other Ambulatory Visit: Payer: Self-pay | Admitting: Student

## 2019-01-17 DIAGNOSIS — R599 Enlarged lymph nodes, unspecified: Secondary | ICD-10-CM

## 2019-01-25 ENCOUNTER — Ambulatory Visit: Payer: Self-pay

## 2019-03-22 ENCOUNTER — Other Ambulatory Visit: Payer: Self-pay

## 2019-03-22 DIAGNOSIS — R202 Paresthesia of skin: Secondary | ICD-10-CM

## 2019-04-23 ENCOUNTER — Other Ambulatory Visit: Payer: Self-pay

## 2019-04-24 ENCOUNTER — Ambulatory Visit (INDEPENDENT_AMBULATORY_CARE_PROVIDER_SITE_OTHER): Payer: BC Managed Care – PPO | Admitting: Neurology

## 2019-04-24 ENCOUNTER — Other Ambulatory Visit: Payer: Self-pay

## 2019-04-24 DIAGNOSIS — R202 Paresthesia of skin: Secondary | ICD-10-CM | POA: Diagnosis not present

## 2019-04-24 DIAGNOSIS — G5621 Lesion of ulnar nerve, right upper limb: Secondary | ICD-10-CM

## 2019-04-24 DIAGNOSIS — G5603 Carpal tunnel syndrome, bilateral upper limbs: Secondary | ICD-10-CM

## 2019-04-24 NOTE — Procedures (Signed)
[facility] Neurology  [address] [place], [address]  [place], [place] [address] Tel: [phone] Fax:  [phone] Test Date:  04/24/2019  Patient: Randall Stanley DOB: 1980-07-16 Physician: Nita Sickle, DO  Sex: Male Height: 5\' 9"  Ref Phys: , MD  ID#: Betha Loa Temp: 33.0C Technician:    Patient Complaints: This is a 38 year old man referred for evaluation of bilateral hand numbness, worse on the right.  NCV & EMG Findings: Extensive electrodiagnostic testing of the right upper extremity and additional studies of the left shows:  1. Bilateral median sensory responses show prolonged latency (R3.8, L3.7 ms) and reduced amplitude (R15.9, L18.1 V).  Bilateral ulnar sensory responses are within normal limits. 2. Right median motor response shows prolonged latency (4.1 ms).  Right ulnar motor response shows slowed conduction velocity across the elbow (A Elbow-B Elbow, 45 m/s).  Left median and ulnar motor responses are within normal limits.  3. Chronic motor axonal loss changes are isolated to the right abductor pollicis brevis muscle, without accompanied active denervation.  The remaining tested muscles showed normal motor unit recruitment and configuration.   Impression: 1. Bilateral median neuropathy at or distal to the wrist, consistent with a clinical diagnosis of carpal tunnel syndrome.  Overall, these findings are moderate in degree electrically and worse on the right. 2. Right ulnar neuropathy with slowing across the elbow, purely demyelinating, mild. 3. There is no evidence of a cervical radiculopathy affecting either upper extremity.   ___________________________ 24, DO    Nerve Conduction Studies Anti Sensory Summary Table   Site NR Peak (ms) Norm Peak (ms) P-T Amp (V) Norm P-T Amp  Left Median Anti Sensory (2nd Digit)  33C  Wrist    3.7 <3.4 18.1 >20  Right Median Anti Sensory (2nd Digit)  33C  Wrist    3.8 <3.4 15.9 >20  Left Ulnar Anti Sensory  (5th Digit)  33C  Wrist    2.8 <3.1 21.5 >12  Right Ulnar Anti Sensory (5th Digit)  33C  Wrist    2.8 <3.1 17.8 >12   Motor Summary Table   Site NR Onset (ms) Norm Onset (ms) O-P Amp (mV) Norm O-P Amp Site1 Site2 Delta-0 (ms) Dist (cm) Vel (m/s) Norm Vel (m/s)  Left Median Motor (Abd Poll Brev)  33C  Wrist    3.8 <3.9 10.9 >6 Elbow Wrist 4.8 30.0 63 >50  Elbow    8.6  10.9         Right Median Motor (Abd Poll Brev)  33C  Wrist    4.1 <3.9 8.3 >6 Elbow Wrist 5.1 31.0 61 >50  Elbow    9.2  7.4         Left Ulnar Motor (Abd Dig Minimi)  33C  Wrist    2.3 <3.1 9.1 >7 B Elbow Wrist 4.0 24.0 60 >50  B Elbow    6.3  8.8  A Elbow B Elbow 1.7 10.0 59 >50  A Elbow    8.0  8.6         Right Ulnar Motor (Abd Dig Minimi)  33C  Wrist    2.3 <3.1 9.4 >7 B Elbow Wrist 3.6 23.0 64 >50  B Elbow    5.9  9.0  A Elbow B Elbow 2.2 10.0 45 >50  A Elbow    8.1  8.7          EMG   Side Muscle Ins Act Fibs Psw Fasc Number Recrt Dur Dur. Amp Amp. Poly  Poly. Comment  Right 1stDorInt Nml Nml Nml Nml Nml Nml Nml Nml Nml Nml Nml Nml N/A  Right Abd Poll Brev Nml Nml Nml Nml 1- Rapid Few 1+ Few 1+ Nml Nml N/A  Right PronatorTeres Nml Nml Nml Nml Nml Nml Nml Nml Nml Nml Nml Nml N/A  Right Biceps Nml Nml Nml Nml Nml Nml Nml Nml Nml Nml Nml Nml N/A  Right Triceps Nml Nml Nml Nml Nml Nml Nml Nml Nml Nml Nml Nml N/A  Right Deltoid Nml Nml Nml Nml Nml Nml Nml Nml Nml Nml Nml Nml N/A  Right FlexCarpiUln Nml Nml Nml Nml Nml Nml Nml Nml Nml Nml Nml Nml N/A  Left 1stDorInt Nml Nml Nml Nml Nml Nml Nml Nml Nml Nml Nml Nml N/A  Left Abd Poll Brev Nml Nml Nml Nml Nml Nml Nml Nml Nml Nml Nml Nml N/A  Left PronatorTeres Nml Nml Nml Nml Nml Nml Nml Nml Nml Nml Nml Nml N/A  Left Biceps Nml Nml Nml Nml Nml Nml Nml Nml Nml Nml Nml Nml N/A  Left Triceps Nml Nml Nml Nml Nml Nml Nml Nml Nml Nml Nml Nml N/A  Left Deltoid Nml Nml Nml Nml Nml Nml Nml Nml Nml Nml Nml Nml N/A      Waveforms:

## 2019-05-01 ENCOUNTER — Other Ambulatory Visit: Payer: Self-pay | Admitting: Orthopedic Surgery

## 2019-05-24 ENCOUNTER — Encounter (HOSPITAL_BASED_OUTPATIENT_CLINIC_OR_DEPARTMENT_OTHER): Payer: Self-pay | Admitting: Orthopedic Surgery

## 2019-05-24 ENCOUNTER — Other Ambulatory Visit: Payer: Self-pay

## 2019-05-29 ENCOUNTER — Other Ambulatory Visit (HOSPITAL_COMMUNITY)
Admission: RE | Admit: 2019-05-29 | Discharge: 2019-05-29 | Disposition: A | Discharge status: Home / Self Care | Payer: BC Managed Care – PPO | Source: Ambulatory Visit | Attending: Orthopedic Surgery | Admitting: Orthopedic Surgery

## 2019-05-29 DIAGNOSIS — Z20822 Contact with and (suspected) exposure to covid-19: Secondary | ICD-10-CM | POA: Insufficient documentation

## 2019-05-29 DIAGNOSIS — Z01812 Encounter for preprocedural laboratory examination: Secondary | ICD-10-CM | POA: Insufficient documentation

## 2019-05-30 LAB — NOVEL CORONAVIRUS, NAA (HOSP ORDER, SEND-OUT TO REF LAB; TAT 18-24 HRS): SARS-CoV-2, NAA: NOT DETECTED

## 2019-06-01 ENCOUNTER — Ambulatory Visit (HOSPITAL_BASED_OUTPATIENT_CLINIC_OR_DEPARTMENT_OTHER): Payer: BC Managed Care – PPO | Admitting: Anesthesiology

## 2019-06-01 ENCOUNTER — Encounter (HOSPITAL_BASED_OUTPATIENT_CLINIC_OR_DEPARTMENT_OTHER)
Admission: RE | Disposition: A | Discharge status: Home / Self Care | Payer: Self-pay | Source: Home / Self Care | Attending: Orthopedic Surgery

## 2019-06-01 ENCOUNTER — Ambulatory Visit (HOSPITAL_BASED_OUTPATIENT_CLINIC_OR_DEPARTMENT_OTHER)
Admission: RE | Admit: 2019-06-01 | Discharge: 2019-06-01 | Disposition: A | Discharge status: Home / Self Care | Payer: BC Managed Care – PPO | Attending: Orthopedic Surgery | Admitting: Orthopedic Surgery

## 2019-06-01 ENCOUNTER — Other Ambulatory Visit: Payer: Self-pay

## 2019-06-01 ENCOUNTER — Encounter (HOSPITAL_BASED_OUTPATIENT_CLINIC_OR_DEPARTMENT_OTHER): Payer: Self-pay | Admitting: Orthopedic Surgery

## 2019-06-01 DIAGNOSIS — G5601 Carpal tunnel syndrome, right upper limb: Secondary | ICD-10-CM | POA: Insufficient documentation

## 2019-06-01 DIAGNOSIS — G5621 Lesion of ulnar nerve, right upper limb: Secondary | ICD-10-CM | POA: Insufficient documentation

## 2019-06-01 HISTORY — PX: CARPAL TUNNEL WITH CUBITAL TUNNEL: SHX5608

## 2019-06-01 HISTORY — DX: Personal history of urinary calculi: Z87.442

## 2019-06-01 SURGERY — RELEASE, CARPAL TUNNEL AND CUBITAL TUNNEL
Anesthesia: Monitor Anesthesia Care | Site: Hand | Laterality: Right

## 2019-06-01 MED ORDER — OXYCODONE HCL 5 MG/5ML PO SOLN: 5.0000 mg | Freq: Once | ORAL | Status: DC | PRN

## 2019-06-01 MED ORDER — ESMOLOL HCL 100 MG/10ML IV SOLN
INTRAVENOUS | Status: DC | PRN
  Administered 2019-06-01: 20 mg via INTRAVENOUS

## 2019-06-01 MED ORDER — OXYCODONE HCL 5 MG PO TABS: 5.0000 mg | ORAL_TABLET | Freq: Once | ORAL | Status: DC | PRN

## 2019-06-01 MED ORDER — ESMOLOL HCL 100 MG/10ML IV SOLN
INTRAVENOUS | Status: AC
  Filled 2019-06-01: qty 10

## 2019-06-01 MED ORDER — LIDOCAINE 2% (20 MG/ML) 5 ML SYRINGE
INTRAMUSCULAR | Status: DC | PRN
  Administered 2019-06-01: 80 mg via INTRAVENOUS

## 2019-06-01 MED ORDER — CEFAZOLIN SODIUM-DEXTROSE 2-4 GM/100ML-% IV SOLN
2.0000 g | INTRAVENOUS | Status: AC
  Administered 2019-06-01: 15:00:00 2 g via INTRAVENOUS

## 2019-06-01 MED ORDER — PROMETHAZINE HCL 25 MG/ML IJ SOLN: 6.2500 mg | INTRAMUSCULAR | Status: DC | PRN

## 2019-06-01 MED ORDER — CHLORHEXIDINE GLUCONATE 4 % EX LIQD: 60.0000 mL | Freq: Once | CUTANEOUS | Status: DC

## 2019-06-01 MED ORDER — DEXAMETHASONE SODIUM PHOSPHATE 10 MG/ML IJ SOLN
INTRAMUSCULAR | Status: DC | PRN
  Administered 2019-06-01: 10 mg via INTRAVENOUS

## 2019-06-01 MED ORDER — BUPIVACAINE HCL (PF) 0.25 % IJ SOLN
INTRAMUSCULAR | Status: DC | PRN
  Administered 2019-06-01: 10 mL

## 2019-06-01 MED ORDER — MIDAZOLAM HCL 2 MG/2ML IJ SOLN
1.0000 mg | INTRAMUSCULAR | Status: DC | PRN
  Administered 2019-06-01: 15:00:00 2 mg via INTRAVENOUS

## 2019-06-01 MED ORDER — MIDAZOLAM HCL 2 MG/2ML IJ SOLN
INTRAMUSCULAR | Status: AC
  Filled 2019-06-01: qty 2

## 2019-06-01 MED ORDER — CEFAZOLIN SODIUM-DEXTROSE 2-4 GM/100ML-% IV SOLN
INTRAVENOUS | Status: AC
  Filled 2019-06-01: qty 100

## 2019-06-01 MED ORDER — PROPOFOL 10 MG/ML IV BOLUS
INTRAVENOUS | Status: AC
  Filled 2019-06-01: qty 20

## 2019-06-01 MED ORDER — KETOROLAC TROMETHAMINE 30 MG/ML IJ SOLN
INTRAMUSCULAR | Status: DC | PRN
  Administered 2019-06-01: 30 mg via INTRAVENOUS

## 2019-06-01 MED ORDER — FENTANYL CITRATE (PF) 100 MCG/2ML IJ SOLN: 25.0000 ug | INTRAMUSCULAR | Status: DC | PRN

## 2019-06-01 MED ORDER — HYDROCODONE-ACETAMINOPHEN 5-325 MG PO TABS: ORAL_TABLET | ORAL | 0 refills | Status: DC

## 2019-06-01 MED ORDER — FENTANYL CITRATE (PF) 100 MCG/2ML IJ SOLN
50.0000 ug | INTRAMUSCULAR | Status: DC | PRN
  Administered 2019-06-01 (×2): 50 ug via INTRAVENOUS

## 2019-06-01 MED ORDER — FENTANYL CITRATE (PF) 100 MCG/2ML IJ SOLN
INTRAMUSCULAR | Status: AC
  Filled 2019-06-01: qty 2

## 2019-06-01 MED ORDER — LACTATED RINGERS IV SOLN: INTRAVENOUS | Status: DC

## 2019-06-01 MED ORDER — PROPOFOL 10 MG/ML IV BOLUS
INTRAVENOUS | Status: DC | PRN
  Administered 2019-06-01: 200 mg via INTRAVENOUS

## 2019-06-01 MED ORDER — ONDANSETRON HCL 4 MG/2ML IJ SOLN
INTRAMUSCULAR | Status: DC | PRN
  Administered 2019-06-01: 4 mg via INTRAVENOUS

## 2019-06-01 MED ORDER — PROPOFOL 500 MG/50ML IV EMUL
INTRAVENOUS | Status: AC
  Filled 2019-06-01: qty 50

## 2019-06-01 SURGICAL SUPPLY — 59 items
APL PRP STRL LF DISP 70% ISPRP (MISCELLANEOUS) ×1
BLADE MINI RND TIP GREEN BEAV (BLADE) ×2 IMPLANT
BLADE SURG 15 STRL LF DISP TIS (BLADE) ×2 IMPLANT
BLADE SURG 15 STRL SS (BLADE) ×6
BNDG CMPR 9X4 STRL LF SNTH (GAUZE/BANDAGES/DRESSINGS) ×1
BNDG ELASTIC 3X5.8 VLCR STR LF (GAUZE/BANDAGES/DRESSINGS) ×6 IMPLANT
BNDG ESMARK 4X9 LF (GAUZE/BANDAGES/DRESSINGS) ×2 IMPLANT
BNDG GAUZE ELAST 4 BULKY (GAUZE/BANDAGES/DRESSINGS) ×3 IMPLANT
CHLORAPREP W/TINT 26 (MISCELLANEOUS) ×3 IMPLANT
CORD BIPOLAR FORCEPS 12FT (ELECTRODE) ×3 IMPLANT
COVER BACK TABLE 60X90IN (DRAPES) ×3 IMPLANT
COVER MAYO STAND STRL (DRAPES) ×3 IMPLANT
COVER WAND RF STERILE (DRAPES) IMPLANT
CUFF TOURN SGL QUICK 18X4 (TOURNIQUET CUFF) ×2 IMPLANT
DECANTER SPIKE VIAL GLASS SM (MISCELLANEOUS) IMPLANT
DRAPE EXTREMITY T 121X128X90 (DISPOSABLE) ×3 IMPLANT
DRAPE SURG 17X23 STRL (DRAPES) ×3 IMPLANT
DRSG PAD ABDOMINAL 8X10 ST (GAUZE/BANDAGES/DRESSINGS) ×3 IMPLANT
GAUZE 4X4 16PLY RFD (DISPOSABLE) IMPLANT
GAUZE SPONGE 4X4 12PLY STRL (GAUZE/BANDAGES/DRESSINGS) ×3 IMPLANT
GAUZE XEROFORM 1X8 LF (GAUZE/BANDAGES/DRESSINGS) ×3 IMPLANT
GLOVE BIO SURGEON STRL SZ 6.5 (GLOVE) ×1 IMPLANT
GLOVE BIO SURGEON STRL SZ7.5 (GLOVE) ×3 IMPLANT
GLOVE BIO SURGEONS STRL SZ 6.5 (GLOVE) ×1
GLOVE BIOGEL PI IND STRL 8 (GLOVE) ×1 IMPLANT
GLOVE BIOGEL PI INDICATOR 8 (GLOVE) ×2
GLOVE EXAM NITRILE MD LF STRL (GLOVE) ×2 IMPLANT
GLOVE SURG ORTHO 8.0 STRL STRW (GLOVE) ×3 IMPLANT
GOWN STRL REUS W/ TWL LRG LVL3 (GOWN DISPOSABLE) ×1 IMPLANT
GOWN STRL REUS W/ TWL XL LVL3 (GOWN DISPOSABLE) IMPLANT
GOWN STRL REUS W/TWL LRG LVL3 (GOWN DISPOSABLE) ×3
GOWN STRL REUS W/TWL XL LVL3 (GOWN DISPOSABLE) ×5 IMPLANT
LOOP VESSEL MAXI BLUE (MISCELLANEOUS) IMPLANT
NDL HYPO 25X1 1.5 SAFETY (NEEDLE) IMPLANT
NDL SUT 6 .5 CRC .975X.05 MAYO (NEEDLE) IMPLANT
NEEDLE HYPO 25X1 1.5 SAFETY (NEEDLE) ×3 IMPLANT
NEEDLE MAYO TAPER (NEEDLE)
NS IRRIG 1000ML POUR BTL (IV SOLUTION) ×3 IMPLANT
PACK BASIN DAY SURGERY FS (CUSTOM PROCEDURE TRAY) ×3 IMPLANT
PAD CAST 3X4 CTTN HI CHSV (CAST SUPPLIES) ×1 IMPLANT
PAD CAST 4YDX4 CTTN HI CHSV (CAST SUPPLIES) ×1 IMPLANT
PADDING CAST ABS 4INX4YD NS (CAST SUPPLIES)
PADDING CAST ABS COTTON 4X4 ST (CAST SUPPLIES) ×1 IMPLANT
PADDING CAST COTTON 3X4 STRL (CAST SUPPLIES) ×3
PADDING CAST COTTON 4X4 STRL (CAST SUPPLIES)
SLEEVE SCD COMPRESS KNEE MED (MISCELLANEOUS) IMPLANT
SPLINT FAST PLASTER 5X30 (CAST SUPPLIES)
SPLINT PLASTER CAST FAST 5X30 (CAST SUPPLIES) IMPLANT
SPLINT PLASTER CAST XFAST 3X15 (CAST SUPPLIES) IMPLANT
SPLINT PLASTER XTRA FASTSET 3X (CAST SUPPLIES)
STOCKINETTE 4X48 STRL (DRAPES) ×3 IMPLANT
SUT ETHILON 4 0 PS 2 18 (SUTURE) ×5 IMPLANT
SUT VIC AB 2-0 SH 27 (SUTURE) ×3
SUT VIC AB 2-0 SH 27XBRD (SUTURE) ×1 IMPLANT
SUT VICRYL 4-0 PS2 18IN ABS (SUTURE) IMPLANT
SYR BULB 3OZ (MISCELLANEOUS) ×3 IMPLANT
SYR CONTROL 10ML LL (SYRINGE) ×3 IMPLANT
TOWEL GREEN STERILE FF (TOWEL DISPOSABLE) ×3 IMPLANT
UNDERPAD 30X36 HEAVY ABSORB (UNDERPADS AND DIAPERS) ×3 IMPLANT

## 2019-06-01 NOTE — Op Note (Signed)
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  Right ulnar nerve decompression at the elbow 2.  Right carpal tunnel release.  SURGEON:  Leanora Cover, MD  ASSISTANT: Daryll Brod, MD.  ANESTHESIA: General  IV FLUIDS:  Per anesthesia flow sheet.  ESTIMATED BLOOD LOSS:  Minimal.  COMPLICATIONS:  None.  SPECIMENS:  None.  TOURNIQUET TIME:    Total Tourniquet Time Documented: Upper Arm (Right) - 33 minutes Total: Upper Arm (Right) - 33 minutes   DISPOSITION:  Stable to PACU.  LOCATION: [facility]  INDICATIONS: 39 year old male with numbness and tingling in the right hand.  He has positive nerve conduction studies for compression of the ulnar nerve at the elbow and the median nerve at the carpal tunnel. He wishes to have a carpal tunnel release and ulnar nerve decompression at the elbow with possible transposition as necessary for management of his symptoms.  Risks, benefits and alternatives of surgery were discussed including the risk of blood loss; infection; damage to nerves, vessels, tendons, ligaments, bone; failure of surgery; need for additional surgery; complications with wound healing; continued pain; recurrence of carpal tunnel syndrome; and damage to motor branch. He voiced understanding of these risks and elected to proceed.   OPERATIVE COURSE:  After being identified preoperatively by myself, the patient and I agreed upon the procedure and site of procedure.  The surgical site was marked.  The risks, benefits, and alternatives of the surgery were reviewed and he wished to proceed.  Surgical consent had been signed.  He was given IV Ancef as preoperative antibiotic prophylaxis.  He was transferred to the operating  room and placed on the operating room table in supine position with the Right upper extremity on an armboard.  General anesthesia was induced by the anesthesiologist.  Right upper extremity was prepped and draped in normal sterile orthopaedic fashion.  A surgical pause was performed between the surgeons, anesthesia, and operating room staff, and all were in agreement as to the patient, procedure, and site of procedure.  Tourniquet at the proximal aspect of the extremity was inflated to 250 mmHg after exsanguination of the arm with an Esmarch bandage  Incision was made over the transverse carpal ligament and carried into the subcutaneous tissues by spreading technique.  Bipolar electrocautery was used to obtain hemostasis.  The palmar fascia was sharply incised.  The transverse carpal ligament was identified and sharply incised.  It was incised distally first.  Care was taken to ensure complete decompression distally.  It was then incised proximally.  Scissors were used to split the distal aspect of the volar antebrachial fascia.  A finger was placed into the wound to ensure complete decompression, which was the case.  The nerve was examined.  It was adherent to the radial leaflet.  The motor branch was identified and was intact.  The wound was copiously irrigated with sterile saline.  It was then closed with 4-0 nylon in a horizontal mattress fashion.  Incision was made at the medial side of the elbow.  This is carried in subcutaneous tissues by spreading technique.  Bipolar electrocautery was used to obtain hemostasis.  The ulnar nerve was identified just proximal to Osborne's ligament.  It was decompressed by releasing Osborne's ligament and then decompressed distally.  The fascia over the muscle was released and the muscle of the FCU spread.  The investing fascia over the nerve was then released under direct visualization.  The nerve was protected with a KMI guide.  The nerve was then decompressed proximally.   Again the nerve was protected with the guide and the investing fascia surrounding nerve released.  Finger was placed into the wound to ensure complete decompression which was the case.  The elbow was placed into flexion.  The nerve did not subluxate out of the groove.  The wound was copiously irrigated with sterile saline.  2-0 Vicryl was used in an inverted interrupted fashion the subcutaneous tissues and skin was closed with 4-0 nylon in a horizontal mattress fashion.  The wounds were injected with 0.25% plain Marcaine to aid in postoperative analgesia.  They were dressed with sterile Xeroform, 4x4s, an ABD, and wrapped with Kerlix and an Ace bandage.  Tourniquet was deflated at 33 minutes.  Fingertips were pink with brisk capillary refill after deflation of the tourniquet.  Operative drapes were broken down.  The patient was awoken from anesthesia safely.  He was transferred back to stretcher and taken to the PACU in stable condition.  I will see him back in the office in 1 week for postoperative followup.  I will give him a prescription for Norco 5/325 1-2 tabs PO q6 hours prn pain, dispense # 20.    Betha Loa, MD Electronically signed, 06/01/19

## 2019-06-01 NOTE — Discharge Instructions (Addendum)

## 2019-06-01 NOTE — Anesthesia Preprocedure Evaluation (Addendum)
Anesthesia Evaluation  Patient identified by MRN, date of birth, ID band Patient awake    Reviewed: Allergy & Precautions, NPO status , Patient's Chart, lab work & pertinent test results  History of Anesthesia Complications Negative for: history of anesthetic complications  Airway Mallampati: II  TM Distance: >3 FB Neck ROM: Full    Dental  (+) Dental Advisory Given, Poor Dentition   Pulmonary neg pulmonary ROS,    Pulmonary exam normal        Cardiovascular negative cardio ROS Normal cardiovascular exam     Neuro/Psych negative neurological ROS  negative psych ROS   GI/Hepatic Neg liver ROS, GERD  Controlled,  Endo/Other   Obesity   Renal/GU negative Renal ROS     Musculoskeletal  Gout    Abdominal   Peds  Hematology negative hematology ROS (+)   Anesthesia Other Findings Covid neg 1/19  Reproductive/Obstetrics                            Anesthesia Physical Anesthesia Plan  ASA: II  Anesthesia Plan: General   Post-op Pain Management:    Induction: Intravenous  PONV Risk Score and Plan: Treatment may vary due to age or medical condition, Ondansetron, Dexamethasone and Midazolam  Airway Management Planned: LMA  Additional Equipment: None  Intra-op Plan:   Post-operative Plan: Extubation in OR  Informed Consent: I have reviewed the patients History and Physical, chart, labs and discussed the procedure including the risks, benefits and alternatives for the proposed anesthesia with the patient or authorized representative who has indicated his/her understanding and acceptance.     Dental advisory given  Plan Discussed with: CRNA and Anesthesiologist  Anesthesia Plan Comments: (Discussed brachial plexus block with patient. Patient would prefer to proceed with surgery without block, but is agreeable to receiving one postoperatively if he is in considerable pain despite  other analgesic modalities.)       Anesthesia Quick Evaluation

## 2019-06-01 NOTE — Anesthesia Postprocedure Evaluation (Signed)
Anesthesia Post Note  Patient: Randall Stanley  Procedure(s) Performed: RIGHT CARPAL TUNNEL AND RIGHT ULNAR NERVE DECOMPRESSION AT RIGHT ELBOW (Right Hand)     Patient location during evaluation: PACU Anesthesia Type: Regional Level of consciousness: awake and alert Pain management: pain level controlled Vital Signs Assessment: post-procedure vital signs reviewed and stable Respiratory status: spontaneous breathing, nonlabored ventilation and respiratory function stable Cardiovascular status: blood pressure returned to baseline and stable Postop Assessment: no apparent nausea or vomiting Anesthetic complications: no    Last Vitals:  Vitals:   06/01/19 1630 06/01/19 1654  BP: (!) 141/88 136/78  Pulse: 93 86  Resp: 17 18  Temp:  36.7 C  SpO2: 94% 97%    Last Pain:  Vitals:   06/01/19 1654  TempSrc:   PainSc: 2                  Lowella Curb

## 2019-06-01 NOTE — Transfer of Care (Signed)
Immediate Anesthesia Transfer of Care Note  Patient: Randall Stanley  Procedure(s) Performed: RIGHT CARPAL TUNNEL AND RIGHT ULNAR NERVE DECOMPRESSION AT RIGHT ELBOW (Right Hand)  Patient Location: PACU  Anesthesia Type:General  Level of Consciousness: drowsy and patient cooperative  Airway & Oxygen Therapy: Patient Spontanous Breathing and Patient connected to face mask oxygen  Post-op Assessment: Report given to RN and Post -op Vital signs reviewed and stable  Post vital signs: Reviewed and stable  Last Vitals:  Vitals Value Taken Time  BP 133/100 06/01/19 1608  Temp 36.8 C 06/01/19 1608  Pulse 94 06/01/19 1613  Resp 16 06/01/19 1613  SpO2 98 % 06/01/19 1613  Vitals shown include unvalidated device data.  Last Pain:  Vitals:   06/01/19 1608  TempSrc:   PainSc: Asleep      Patients Stated Pain Goal: 4 (06/01/19 1313)  Complications: No apparent anesthesia complications

## 2019-06-01 NOTE — Op Note (Signed)
I assisted Surgeon(s) and Role:    * Betha Loa, MD - Primary    * Cindee Salt, MD - Assisting on the Procedure(s): RIGHT CARPAL TUNNEL WITH RIGHT ULNAR NERVE DECOMPRESSION AT RIGHT ELBOW, POSSIBLE TRANSPOSITION on 06/01/2019.  I provided assistance on this case as follows: setup approach. Identification of the medain nerve at the wrist and ulnar nerve at the elbow. Retraction, release of the median nerve at the wrist and ulnar nerve at the elbow, closure of the incisions and application of the dressings. Electronically signed by: Cindee Salt, MD Date: 06/01/2019 Time: 4:04 PM

## 2019-06-01 NOTE — Anesthesia Procedure Notes (Signed)
Procedure Name: LMA Insertion Date/Time: 06/01/2019 3:17 PM Performed by: Marny Lowenstein, CRNA Pre-anesthesia Checklist: Patient identified, Emergency Drugs available, Suction available and Patient being monitored Patient Re-evaluated:Patient Re-evaluated prior to induction Oxygen Delivery Method: Circle system utilized Preoxygenation: Pre-oxygenation with 100% oxygen Induction Type: IV induction Ventilation: Mask ventilation without difficulty and Oral airway inserted - appropriate to patient size LMA: LMA inserted LMA Size: 4.0 Number of attempts: 1 Placement Confirmation: positive ETCO2 and breath sounds checked- equal and bilateral Tube secured with: Tape Dental Injury: Teeth and Oropharynx as per pre-operative assessment

## 2019-06-01 NOTE — H&P (Signed)
Randall Stanley is an 39 y.o. male.   Chief Complaint: right carpal tunnel and ulnar nerve compression HPI: 39 yo male with numbness and tingling in right hand.  Positive nerve conduction studies.  He wishes to have right carpal tunnel release and ulnar nerve decompression at the elbow with transposition as necessary.  Allergies: No Known Allergies  Past Medical History:  Diagnosis Date  . Chicken pox   . Gout   . History of kidney stones   . Scarlet fever     History reviewed. No pertinent surgical history.  Family History: Family History  Problem Relation Age of Onset  . Parkinson's disease Maternal Grandmother   . Hyperlipidemia Father   . Stroke Father   . Rheumatic fever Father   . Heart attack Father   . Kidney failure Father   . Diabetes Father   . COPD Mother     Social History:   reports that he has never smoked. He has never used smokeless tobacco. He reports current alcohol use. He reports that he does not use drugs.  Medications: No medications prior to admission.    No results found for this or any previous visit (from the past 48 hour(s)).  No results found.   A comprehensive review of systems was negative.  Blood pressure (!) 149/92, pulse 92, temperature (!) 97.5 F (36.4 C), temperature source Tympanic, resp. rate 18, height 5\' 9"  (1.753 m), weight 107.4 kg, SpO2 98 %.  General appearance: alert, cooperative and appears stated age Head: Normocephalic, without obvious abnormality, atraumatic Neck: supple, symmetrical, trachea midline Cardio: regular rate and rhythm Resp: clear to auscultation bilaterally Extremities: Intact sensation and capillary refill all digits.  +epl/fpl/io.  No wounds.  Pulses: 2+ and symmetric Skin: Skin color, texture, turgor normal. No rashes or lesions Neurologic: Grossly normal Incision/Wound: none  Assessment/Plan Right carpal tunnel syndrome and ulnar nerve compression at the elbow.  Non operative and operative  treatment options have been discussed with the patient and patient wishes to proceed with operative treatment. Risks, benefits, and alternatives of surgery have been discussed and the patient agrees with the plan of care.   06/01/2019, 1:19 PM

## 2019-06-04 ENCOUNTER — Encounter: Payer: Self-pay | Admitting: *Deleted

## 2019-11-30 ENCOUNTER — Ambulatory Visit
Admission: RE | Admit: 2019-11-30 | Discharge: 2019-11-30 | Disposition: A | Discharge status: Home / Self Care | Payer: BC Managed Care – PPO | Source: Ambulatory Visit | Attending: Student | Admitting: Student

## 2019-11-30 ENCOUNTER — Other Ambulatory Visit: Payer: Self-pay

## 2019-11-30 DIAGNOSIS — R599 Enlarged lymph nodes, unspecified: Secondary | ICD-10-CM | POA: Diagnosis present

## 2020-01-08 ENCOUNTER — Other Ambulatory Visit: Payer: Self-pay | Admitting: Otolaryngology

## 2020-01-08 DIAGNOSIS — R599 Enlarged lymph nodes, unspecified: Secondary | ICD-10-CM

## 2020-01-18 ENCOUNTER — Ambulatory Visit: Admission: RE | Admit: 2020-01-18 | Payer: BC Managed Care – PPO | Source: Ambulatory Visit

## 2020-02-08 ENCOUNTER — Ambulatory Visit
Admission: RE | Admit: 2020-02-08 | Discharge: 2020-02-08 | Disposition: A | Discharge status: Home / Self Care | Payer: BC Managed Care – PPO | Source: Ambulatory Visit | Attending: Otolaryngology | Admitting: Otolaryngology

## 2020-02-08 ENCOUNTER — Other Ambulatory Visit: Payer: Self-pay

## 2020-02-08 DIAGNOSIS — R599 Enlarged lymph nodes, unspecified: Secondary | ICD-10-CM | POA: Diagnosis not present

## 2020-02-08 MED ORDER — IOHEXOL 300 MG/ML  SOLN
75.0000 mL | Freq: Once | INTRAMUSCULAR | Status: AC | PRN
  Administered 2020-02-08: 75 mL via INTRAVENOUS

## 2020-04-27 DIAGNOSIS — M65311 Trigger thumb, right thumb: Secondary | ICD-10-CM | POA: Diagnosis not present

## 2020-09-02 ENCOUNTER — Emergency Department (HOSPITAL_COMMUNITY)
Admission: EM | Admit: 2020-09-02 | Discharge: 2020-09-02 | Disposition: A | Discharge status: Home / Self Care | Payer: BC Managed Care – PPO

## 2020-09-02 ENCOUNTER — Emergency Department
Admission: EM | Admit: 2020-09-02 | Discharge: 2020-09-02 | Disposition: A | Discharge status: Home / Self Care | Payer: Self-pay | Attending: Emergency Medicine | Admitting: Emergency Medicine

## 2020-09-02 ENCOUNTER — Other Ambulatory Visit: Payer: Self-pay

## 2020-09-02 DIAGNOSIS — H16133 Photokeratitis, bilateral: Secondary | ICD-10-CM | POA: Insufficient documentation

## 2020-09-02 DIAGNOSIS — W890XXA Exposure to welding light (arc), initial encounter: Secondary | ICD-10-CM | POA: Insufficient documentation

## 2020-09-02 DIAGNOSIS — Y9389 Activity, other specified: Secondary | ICD-10-CM | POA: Insufficient documentation

## 2020-09-02 DIAGNOSIS — S0502XA Injury of conjunctiva and corneal abrasion without foreign body, left eye, initial encounter: Secondary | ICD-10-CM | POA: Insufficient documentation

## 2020-09-02 MED ORDER — FLUORESCEIN SODIUM 1 MG OP STRP
1.0000 | ORAL_STRIP | Freq: Once | OPHTHALMIC | Status: DC
  Filled 2020-09-02: qty 1

## 2020-09-02 MED ORDER — HYDROCODONE-ACETAMINOPHEN 5-325 MG PO TABS
1.0000 | ORAL_TABLET | Freq: Once | ORAL | Status: AC
  Administered 2020-09-02: 1 via ORAL
  Filled 2020-09-02: qty 1

## 2020-09-02 MED ORDER — HYDROCODONE-ACETAMINOPHEN 5-325 MG PO TABS: 1.0000 | ORAL_TABLET | ORAL | 0 refills | Status: AC | PRN

## 2020-09-02 MED ORDER — ERYTHROMYCIN 5 MG/GM OP OINT: 1.0000 "application " | TOPICAL_OINTMENT | Freq: Four times a day (QID) | OPHTHALMIC | 0 refills | Status: DC

## 2020-09-02 MED ORDER — IBUPROFEN 600 MG PO TABS
600.0000 mg | ORAL_TABLET | Freq: Once | ORAL | Status: AC
  Administered 2020-09-02: 600 mg via ORAL
  Filled 2020-09-02: qty 1

## 2020-09-02 MED ORDER — ACETAMINOPHEN 325 MG PO TABS
650.0000 mg | ORAL_TABLET | Freq: Once | ORAL | Status: AC
  Administered 2020-09-02: 650 mg via ORAL
  Filled 2020-09-02: qty 2

## 2020-09-02 MED ORDER — TETRACAINE HCL 0.5 % OP SOLN
2.0000 [drp] | Freq: Once | OPHTHALMIC | Status: DC
  Filled 2020-09-02: qty 4

## 2020-09-02 MED ORDER — ERYTHROMYCIN 5 MG/GM OP OINT
TOPICAL_OINTMENT | Freq: Once | OPHTHALMIC | Status: AC
  Administered 2020-09-02: 1 via OPHTHALMIC
  Filled 2020-09-02: qty 1

## 2020-09-02 NOTE — ED Provider Notes (Signed)
[facility] Emergency Department Provider Note  ____________________________________________   Event Date/Time   First MD Initiated Contact with Patient 09/02/20 2112     (approximate)  I have reviewed the triage vital signs and the nursing notes.   HISTORY  Chief Complaint Eye Pain  HPI Randall Stanley is a 40 y.o. male who presents to the emergency department for evaluation of bilateral eye pain and photophobia.  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  He reports that he has had bilateral eye pain redness and blurry vision since that time.  He comes in today with sunglasses on 9 PM at night due to the significant photophobia.  He states he did something similar approximately 20 years ago but nothing recent.  He denies wearing contact lenses.  Denies any trauma or anything hitting the eyes.  He states that he does not believe any metal could have gotten into the eyes due to the materials that he was working with.  He states his tetanus is up-to-date.         Past Medical History:  Diagnosis Date  . Chicken pox   . Gout   . History of kidney stones   . Scarlet fever     Patient Active Problem List   Diagnosis Date Noted  . Preventative health care 12/11/2015  . GERD (gastroesophageal reflux disease) 09/05/2015  . Gout 09/05/2015    Past Surgical History:  Procedure Laterality Date  . CARPAL TUNNEL WITH CUBITAL TUNNEL Right 06/01/2019   Procedure: RIGHT CARPAL TUNNEL AND RIGHT ULNAR NERVE DECOMPRESSION AT RIGHT ELBOW;  Surgeon: Betha Loa, MD;  Location: [facility];  Service: Orthopedics;  Laterality: Right;  hand and upper arm    Prior to Admission medications   Medication Sig Start Date End Date Taking? Authorizing Provider  erythromycin ophthalmic ointment Place 1 application into both eyes 4 (four) times daily. 09/02/20  Yes Lucy Chris, PA   HYDROcodone-acetaminophen (NORCO) 5-325 MG tablet Take 1 tablet by mouth every 4 (four) hours as needed for up to 2 days for moderate pain. 09/02/20 09/04/20 Yes Lucy Chris, PA    Allergies Patient has no known allergies.  Family History  Problem Relation Age of Onset  . Parkinson's disease Maternal Grandmother   . Hyperlipidemia Father   . Stroke Father   . Rheumatic fever Father   . Heart attack Father   . Kidney failure Father   . Diabetes Father   . COPD Mother     Social History Social History   Tobacco Use  . Smoking status: Never Smoker  . Smokeless tobacco: Never Used  Substance Use Topics  . Alcohol use: Yes    Alcohol/week: 0.0 standard drinks    Comment: occasional  . Drug use: No    Review of Systems Constitutional: No fever/chills Eyes:+ Blurred vision,+ photophobia,+ bilateral eye pain ENT: No sore throat. Cardiovascular: Denies chest pain. Respiratory: Denies shortness of breath. Gastrointestinal: No abdominal pain.  No nausea, no vomiting.  No diarrhea.  No constipation. Genitourinary: Negative for dysuria. Musculoskeletal: Negative for back pain. Skin: Negative for rash. Neurological: Negative for headaches, focal weakness or numbness.   ____________________________________________   PHYSICAL EXAM:  VITAL SIGNS: ED Triage Vitals [09/02/20 2116]  Enc Vitals Group     BP (!) 142/104     Pulse Rate (!) 12     Resp  18     Temp 98.5 F (36.9 C)     Temp Source Oral     SpO2 100 %     Weight 230 lb (104.3 kg)     Height 5\' 9"  (1.753 m)     Head Circumference      Peak Flow      Pain Score 10     Pain Loc      Pain Edu?      Excl. in [place]?    Constitutional: Alert and oriented. Well appearing and in no acute distress. Eyes: Conjunctiva are diffusely erythematous and injected bilaterally.  Pupils are equal round reactive to light.  Patient with difficulty tolerating any exam prior to tetracaine application.  Following application,  patient noted to have appropriate EOMs.  No corneal ulcer or foreign body noted with white light exam.  Fluorescein staining revealed diffuse scattered punctate uptake in the corneas bilaterally.  The left eye does have a small area of increased fluorescein uptake slightly bigger than the other punctate lesions.  This is located over the pupil with approximately the 5 o'clock position.  No identified foreign body with white light or fluorescein stain.  No evidence of hyphema or globe injury. Head: Atraumatic. Nose: No congestion/rhinnorhea. Mouth/Throat: Mucous membranes are moist.   Neck: No stridor.   Neurologic:  Normal speech and language. No gross focal neurologic deficits are appreciated. No gait instability. Skin:  Skin is warm, dry and intact. No rash noted. Psychiatric: Mood and affect are normal. Speech and behavior are normal.  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  See HPI for full details.  In triage, patient has mild hypertension but otherwise grossly normal vital signs.  On physical exam, the patient has diffuse injection erythematous conjunctive a.  Fluorescein staining reveals punctate uptake in a diffuse scattered pattern with 1 small area on the left pupil concerning for additional abrasion.  Exam findings suggestive of welding photokeratitis with possible associated corneal abrasion on the left.  Will initiate treatment with oral anti-inflammatories and analgesia and initiate course of erythromycin ointment both for prophylaxis of infection as well as lubrication.  Patient was  instructed to have very close follow-up with ophthalmology he will call their office tomorrow morning to arrange follow-up.  The patient is amenable with the plan, he stable this time for outpatient management.  Return precautions were discussed.      ____________________________________________   FINAL CLINICAL IMPRESSION(S) / ED DIAGNOSES  Final diagnoses:  Photokeratitis of both eyes  Inj conjunctiva and corneal abrasion w/o fb, left eye, init     ED Discharge Orders         Ordered    HYDROcodone-acetaminophen (NORCO) 5-325 MG tablet  Every 4 hours PRN        09/02/20 2205    erythromycin ophthalmic ointment  4 times daily        09/02/20 2205          *Please note:  Randall Stanley was evaluated in Emergency Department on 09/02/2020 for the symptoms described in the history of present illness. He was evaluated in  the context of the global COVID-19 pandemic, which necessitated consideration that the patient might be at risk for infection with the SARS-CoV-2 virus that causes COVID-19. Institutional protocols and algorithms that pertain to the evaluation of patients at risk for COVID-19 are in a state of rapid change based on information released by regulatory bodies including the CDC and federal and state organizations. These policies and algorithms were followed during the patient's care in the ED.  Some ED evaluations and interventions may be delayed as a result of limited staffing during and the pandemic.*   Note:  This document was prepared using Dragon voice recognition software and may include unintentional dictation errors.   Lucy Chris, PA 09/02/20 2214    Merwyn Katos, MD 09/07/20 [phone]

## 2020-09-02 NOTE — ED Notes (Signed)
Received a call that pt was at another facility.

## 2020-09-02 NOTE — ED Triage Notes (Signed)
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

## 2020-09-02 NOTE — ED Notes (Signed)
Medications given to PA.

## 2020-09-02 NOTE — ED Notes (Signed)
Pt has been provided with discharge instructions. Pt denies any questions or concerns at this time. Pt verbalizes understanding for follow up care and d/c.  VSS.  Pt left department with all belongings.  

## 2020-09-02 NOTE — ED Notes (Signed)
R eye 20/30 visual acuity  L eye visual acuity 20/40

## 2020-09-02 NOTE — Discharge Instructions (Addendum)
Please use anti-inflammatory such as ibuprofen, 600 mg 3 times daily.  You have also been prescribed a short course of Norco for pain.  You may take these once every 6 hours as needed.  You may also use an additional 650 mg of Tylenol once every 6 hours as well.  Please use the antibiotic eye ointment 4 times daily in both eyes.  Follow-up very closely with ophthalmology, call their office tomorrow.  Return to the emergency department if you experience any worsening.

## 2020-10-14 IMAGING — US US SOFT TISSUE HEAD/NECK
1 series · 8 of 8 positions shown · non-contrast
Comparison: None.

CLINICAL DATA: Palpable lymph node.

EXAM:
ULTRASOUND OF HEAD/NECK SOFT TISSUES
TECHNIQUE: Ultrasound examination of the head and neck soft tissues was
performed in the area of clinical concern.

[Series 1: us soft tissue head/neck · 0.07mm/px · 8 of 8 slices shown]
[im 1/8]
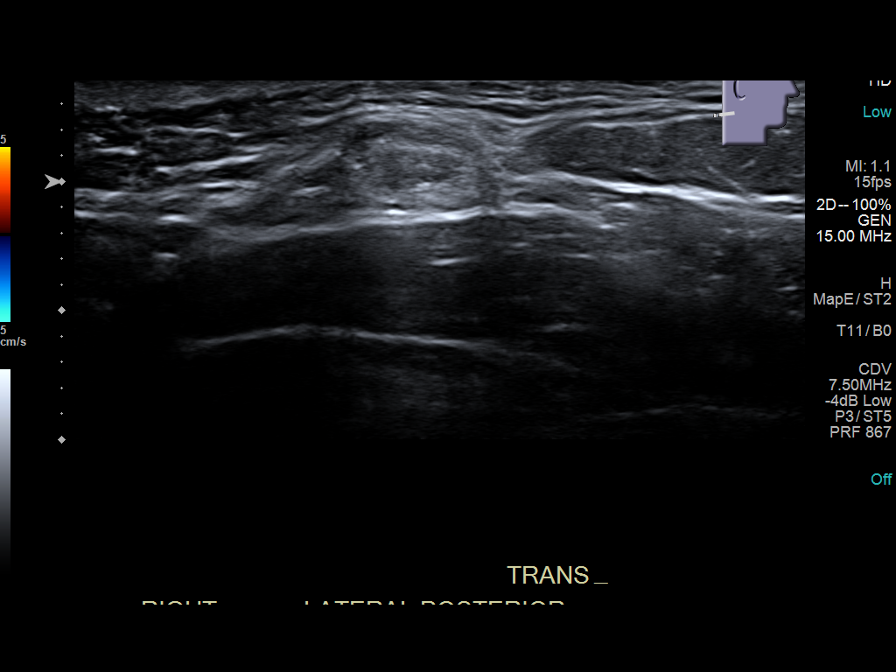
[im 2/8]
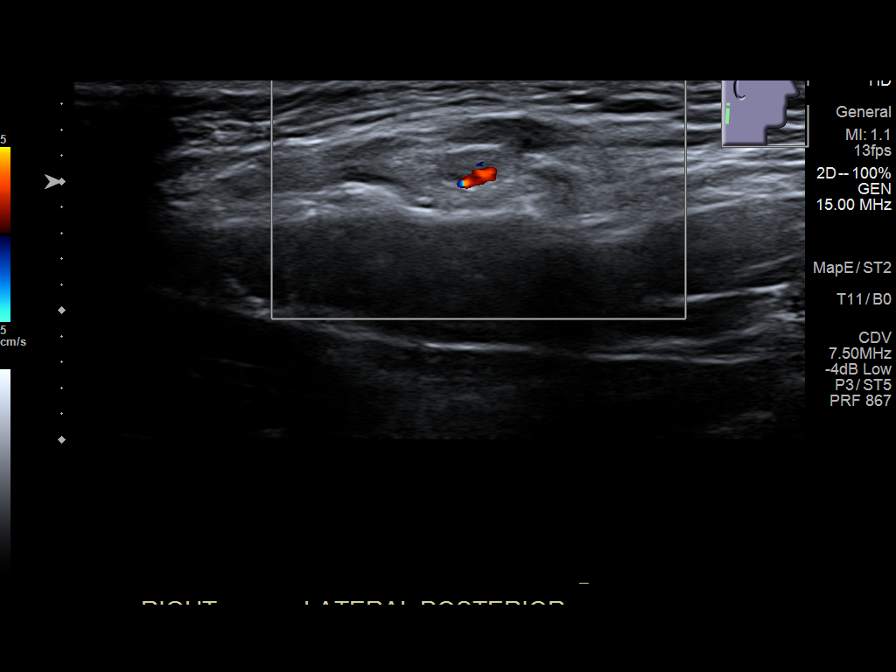
[im 3/8]
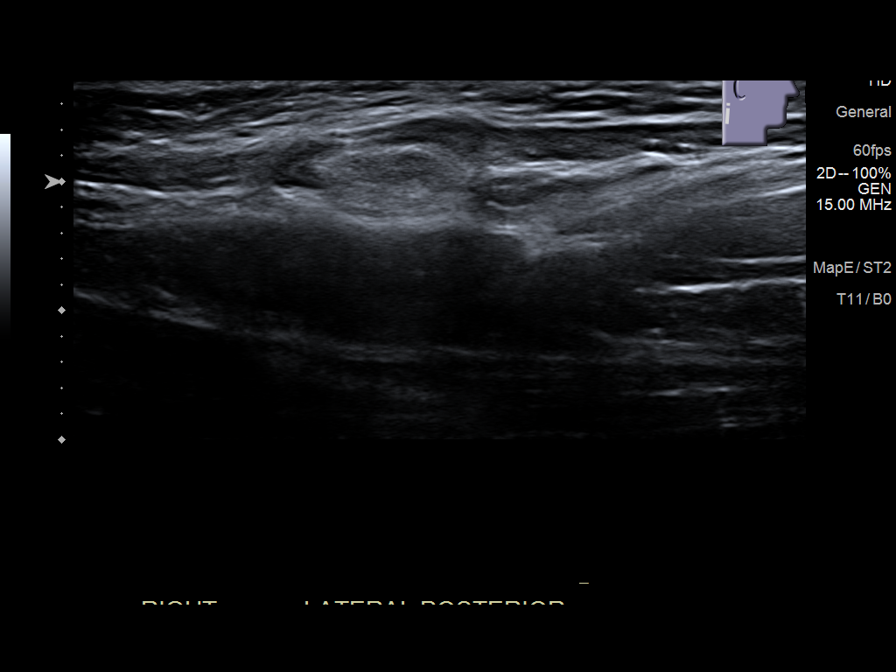
[im 4/8]
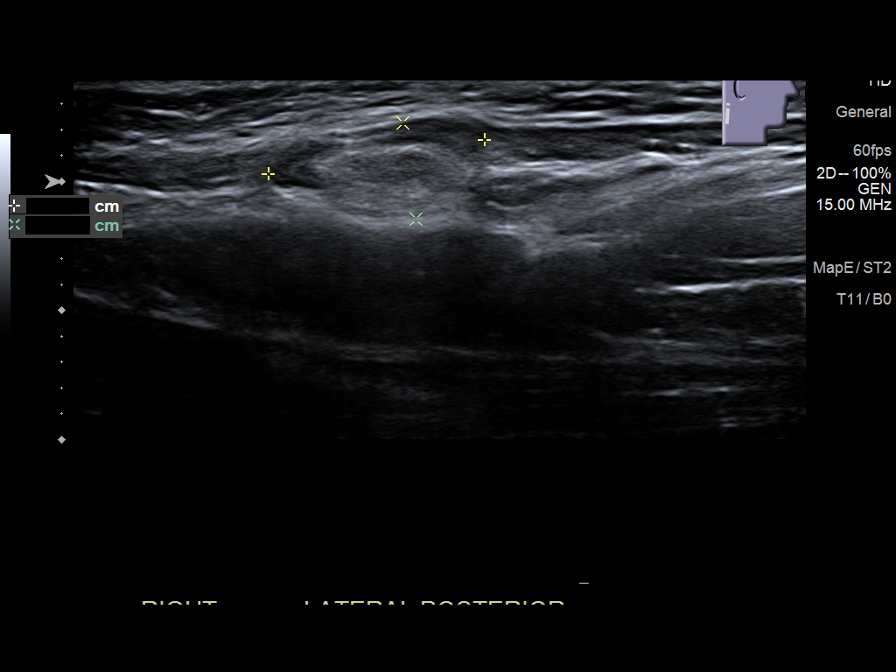
[im 5/8]
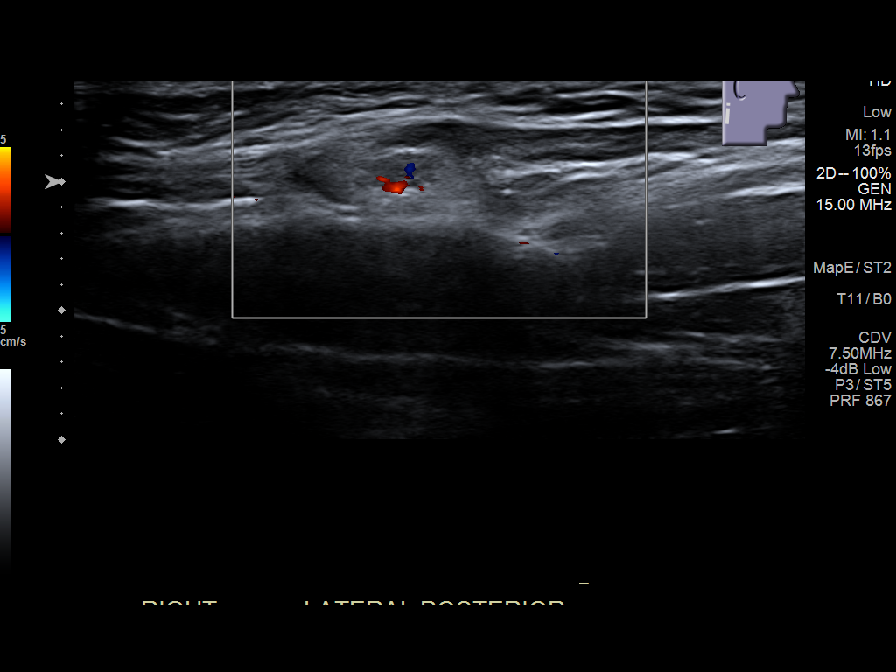
[im 6/8]
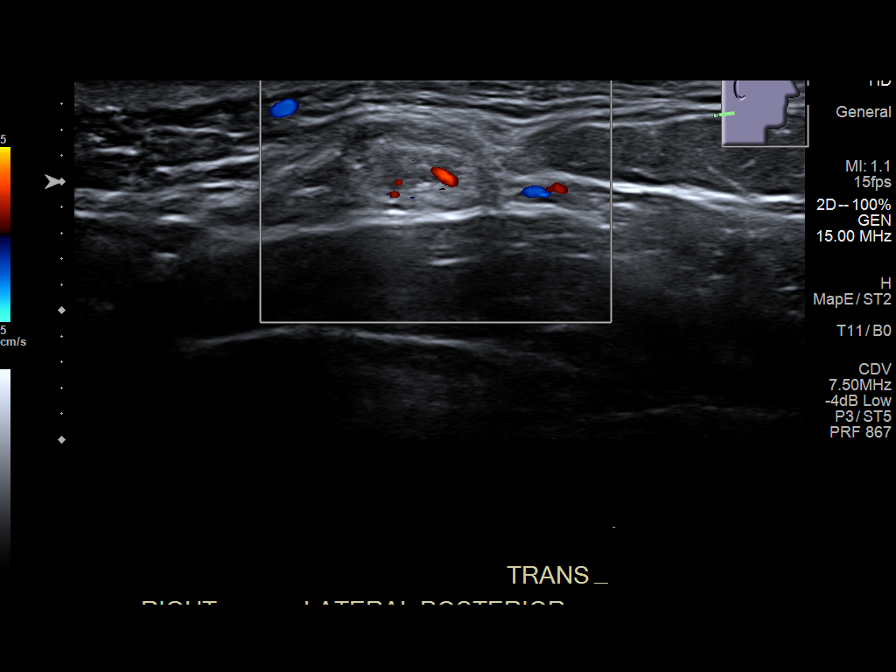
[im 7/8]
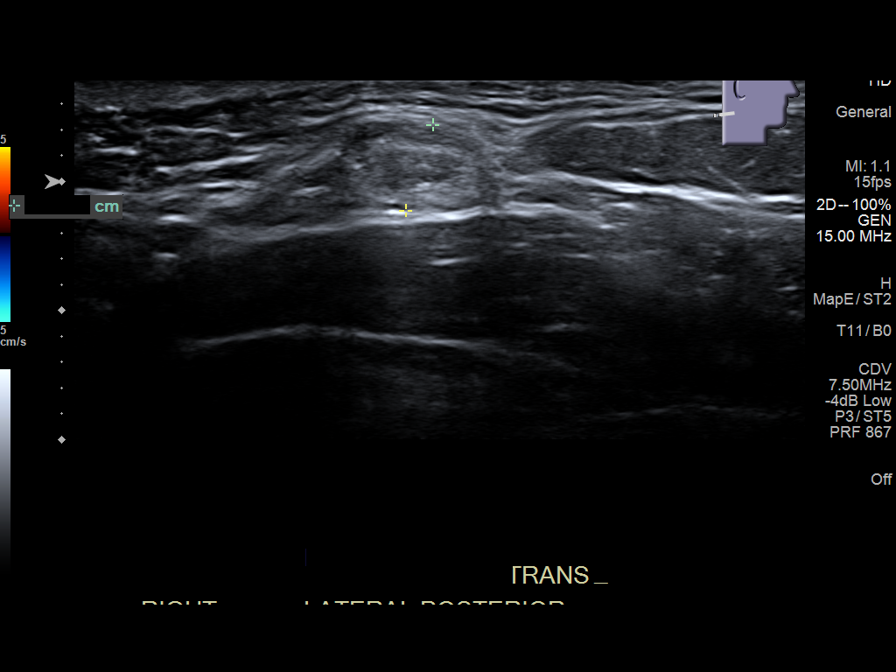
[im 8/8]
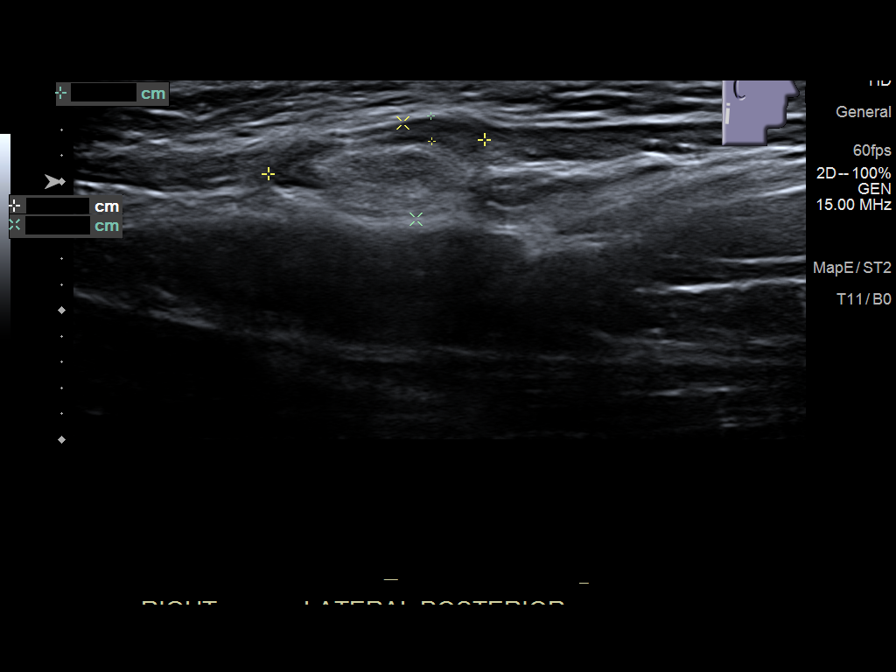

[8 of 8 positions shown; findings below may reference images not displayed]

FINDINGS: In the patient's area of concern in the right neck, there is a 1.7 x
0.8 x 0.7 cm lymph node. This lymph node is ovoid in shape and
demonstrates a fatty hilum. There is no cortical thickening or
overlying skin thickening.
IMPRESSION: The patient's area of concern corresponds to a morphologically
normal lymph node. No further follow-up is recommended in the
absence of interval growth.

## 2021-07-06 ENCOUNTER — Ambulatory Visit
Admission: EM | Admit: 2021-07-06 | Discharge: 2021-07-06 | Disposition: A | Discharge status: Home / Self Care | Payer: BC Managed Care – PPO | Attending: Internal Medicine | Admitting: Internal Medicine

## 2021-07-06 ENCOUNTER — Encounter: Payer: Self-pay | Admitting: Emergency Medicine

## 2021-07-06 ENCOUNTER — Ambulatory Visit (INDEPENDENT_AMBULATORY_CARE_PROVIDER_SITE_OTHER): Payer: BC Managed Care – PPO

## 2021-07-06 ENCOUNTER — Other Ambulatory Visit: Payer: Self-pay

## 2021-07-06 DIAGNOSIS — J209 Acute bronchitis, unspecified: Secondary | ICD-10-CM | POA: Diagnosis not present

## 2021-07-06 DIAGNOSIS — R059 Cough, unspecified: Secondary | ICD-10-CM

## 2021-07-06 DIAGNOSIS — J9801 Acute bronchospasm: Secondary | ICD-10-CM | POA: Diagnosis not present

## 2021-07-06 MED ORDER — AZITHROMYCIN 250 MG PO TABS: 250.0000 mg | ORAL_TABLET | Freq: Every day | ORAL | 0 refills | Status: DC

## 2021-07-06 MED ORDER — ALBUTEROL SULFATE HFA 108 (90 BASE) MCG/ACT IN AERS: 2.0000 | INHALATION_SPRAY | RESPIRATORY_TRACT | 0 refills | Status: DC | PRN

## 2021-07-06 MED ORDER — PREDNISONE 20 MG PO TABS: 20.0000 mg | ORAL_TABLET | Freq: Every day | ORAL | 0 refills | Status: DC

## 2021-07-06 NOTE — ED Triage Notes (Signed)
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

## 2021-07-06 NOTE — ED Provider Notes (Signed)
[facility]    CSN: [id number] Arrival date & time: 07/06/21  1619      History   Chief Complaint Chief Complaint  Patient presents with   Sore Throat   Cough    HPI Randall Stanley is a 41 y.o. 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 He started coughing the first day of the exposure and none else there. Denies being smoker in the past, but had asthma as a child. He plans to leave this company and does not want to summit worker's comp claim. Her cough is productive with green mucous    Past Medical History:  Diagnosis Date   Chicken pox    Gout    History of kidney stones    Scarlet fever     Patient Active Problem List   Diagnosis Date Noted   Preventative health care 12/11/2015   GERD (gastroesophageal reflux disease) 09/05/2015   Gout 09/05/2015    Past Surgical History:  Procedure Laterality Date   CARPAL TUNNEL WITH CUBITAL TUNNEL Right 06/01/2019   Procedure: RIGHT CARPAL TUNNEL AND RIGHT ULNAR NERVE DECOMPRESSION AT RIGHT ELBOW;  Surgeon: Leanora Cover, MD;  Location: [facility];  Service: Orthopedics;  Laterality: Right;  hand and upper arm       Home Medications    Prior to Admission medications   Medication Sig Start Date End Date Taking? Authorizing Provider  albuterol (VENTOLIN HFA) 108 (90 Base) MCG/ACT inhaler Inhale 2 puffs into the lungs every 4 (four) hours as needed for wheezing or shortness of breath. 07/06/21  Yes Rodriguez-Southworth, Sunday Spillers, PA-C  azithromycin (ZITHROMAX) 250 MG tablet Take 1 tablet (250 mg total) by mouth daily. Take first 2 tablets together, then 1 every day until finished. 07/06/21  Yes Rodriguez-Southworth, Sunday Spillers, PA-C  predniSONE (DELTASONE) 20 MG tablet Take 1 tablet (20 mg total) by mouth daily  with breakfast. 07/06/21  Yes Rodriguez-Southworth, Sunday Spillers, PA-C  erythromycin ophthalmic ointment Place 1 application into both eyes 4 (four) times daily. 09/02/20   Marlana Salvage, PA    Family History Family History  Problem Relation Age of Onset   Parkinson's disease Maternal Grandmother    Hyperlipidemia Father    Stroke Father    Rheumatic fever Father    Heart attack Father    Kidney failure Father    Diabetes Father    COPD Mother     Social History Social History   Tobacco Use   Smoking status: Never   Smokeless tobacco: Never  Substance Use Topics   Alcohol use: Yes    Alcohol/week: 0.0 standard drinks    Comment: occasional   Drug use: No     Allergies   Patient has no known allergies.   Review of Systems Review of Systems  Constitutional:  Negative for appetite change, diaphoresis, fatigue and fever.  HENT:  Positive for postnasal drip and sore throat. Negative for congestion, ear discharge and ear pain.   Eyes:  Negative for discharge.  Respiratory:  Positive for cough. Negative for chest tightness, shortness of breath and wheezing.   Musculoskeletal:  Negative for myalgias.  Skin:  Negative for rash.  Neurological:  Negative for headaches.    Physical Exam Triage  Vital Signs ED Triage Vitals  Enc Vitals Group     BP 07/06/21 1727 119/84     Pulse Rate 07/06/21 1727 81     Resp 07/06/21 1727 18     Temp 07/06/21 1727 98.8 F (37.1 C)     Temp Source 07/06/21 1727 Oral     SpO2 07/06/21 1727 96 %     Weight --      Height --      Head Circumference --      Peak Flow --      Pain Score 07/06/21 1729 4     Pain Loc --      Pain Edu? --      Excl. in [place]? --    No data found.  Updated Vital Signs BP 119/84 (BP Location: Right Arm)    Pulse 81    Temp 98.8 F (37.1 C) (Oral)    Resp 18    SpO2 96%   Visual Acuity Right Eye Distance:   Left Eye Distance:   Bilateral Distance:    Right Eye Near:   Left Eye Near:    Bilateral  Near:      Physical Exam Vitals signs and nursing note reviewed.  Constitutional:      General: he is not in acute distress.    Appearance: Normal appearance. hee is not ill-appearing, toxic-appearing or diaphoretic.  HENT:     Head: Normocephalic.     Right Ear: Tympanic membrane, ear canal and external ear normal.     Left Ear: Tympanic membrane, ear canal and external ear normal.     Nose: Nose normal.     Mouth/Throat: mild erythema    Mouth: Mucous membranes are moist.  Eyes:     General: No scleral icterus.       Right eye: No discharge.        Left eye: No discharge.     Conjunctiva/sclera: Conjunctivae normal.  Neck:     Musculoskeletal: Neck supple. No neck rigidity.  Cardiovascular:     Rate and Rhythm: Normal rate and regular rhythm.     Heart sounds: No murmur.  Pulmonary:     Effort: Pulmonary effort is normal.     Breath sounds: Normal breath sounds.   Musculoskeletal: Normal range of motion.  Lymphadenopathy:     Cervical: No cervical adenopathy.  Skin:    General: Skin is warm and dry.     Coloration: Skin is not jaundiced.     Findings: No rash.  Neurological:     Mental Status: he is alert and oriented to person, place, and time.     Gait: Gait normal.  Psychiatric:        Mood and Affect: Mood normal.        Behavior: Behavior normal.        Thought Content: Thought content normal.        Judgment: Judgment normal.    UC Treatments / Results  Labs (all labs ordered are listed, but only abnormal results are displayed) Labs Reviewed - No data to display  EKG   Radiology DG Chest 2 View  Result Date: 07/06/2021 CLINICAL DATA:  Cough. EXAM: CHEST - 2 VIEW COMPARISON:  None. FINDINGS: The heart size and mediastinal contours are within normal limits. Both lungs are clear. The visualized skeletal structures are unremarkable. IMPRESSION: No active cardiopulmonary disease. Electronically Signed   By: Marijo Conception M.D.   On: 07/06/2021 17:52  Procedures Procedures (including critical care time)  Medications Ordered in UC Medications - No data to display  Initial Impression / Assessment and Plan / UC Course  I have reviewed the triage vital signs and the nursing notes.  Pertinent imaging results that were available during my care of the patient were reviewed by me and considered in my medical decision making (see chart for details).  Bronchitis I placed him on Proventil inhaler and Zpack as noted.    Final Clinical Impressions(s) / UC Diagnoses   Final diagnoses:  Bronchospasm  Acute bronchitis, unspecified organism   Discharge Instructions   None    ED Prescriptions     Medication Sig Dispense Auth. Provider   azithromycin (ZITHROMAX) 250 MG tablet Take 1 tablet (250 mg total) by mouth daily. Take first 2 tablets together, then 1 every day until finished. 6 tablet Rodriguez-Southworth, Sunday Spillers, PA-C   albuterol (VENTOLIN HFA) 108 (90 Base) MCG/ACT inhaler Inhale 2 puffs into the lungs every 4 (four) hours as needed for wheezing or shortness of breath. 18 g Rodriguez-Southworth, Sunday Spillers, PA-C   predniSONE (DELTASONE) 20 MG tablet Take 1 tablet (20 mg total) by mouth daily with breakfast. 5 tablet Rodriguez-Southworth, Sunday Spillers, PA-C      PDMP not reviewed this encounter.   Shelby Mattocks, [place] 07/06/21 1855

## 2022-02-01 ENCOUNTER — Ambulatory Visit: Payer: Self-pay | Admitting: Internal Medicine

## 2022-02-08 ENCOUNTER — Ambulatory Visit (INDEPENDENT_AMBULATORY_CARE_PROVIDER_SITE_OTHER): Payer: BC Managed Care – PPO | Admitting: Internal Medicine

## 2022-02-08 ENCOUNTER — Encounter: Payer: Self-pay | Admitting: Internal Medicine

## 2022-02-08 VITALS — BP 122/82 | HR 69 | Temp 98.3°F | Resp 18 | Ht 69.0 in | Wt 241.1 lb

## 2022-02-08 DIAGNOSIS — Z8739 Personal history of other diseases of the musculoskeletal system and connective tissue: Secondary | ICD-10-CM

## 2022-02-08 DIAGNOSIS — N509 Disorder of male genital organs, unspecified: Secondary | ICD-10-CM | POA: Diagnosis not present

## 2022-02-08 DIAGNOSIS — R590 Localized enlarged lymph nodes: Secondary | ICD-10-CM

## 2022-02-08 DIAGNOSIS — Z1159 Encounter for screening for other viral diseases: Secondary | ICD-10-CM

## 2022-02-08 DIAGNOSIS — Z13220 Encounter for screening for lipoid disorders: Secondary | ICD-10-CM

## 2022-02-08 DIAGNOSIS — Z114 Encounter for screening for human immunodeficiency virus [HIV]: Secondary | ICD-10-CM

## 2022-02-08 DIAGNOSIS — Z87898 Personal history of other specified conditions: Secondary | ICD-10-CM

## 2022-02-08 DIAGNOSIS — F419 Anxiety disorder, unspecified: Secondary | ICD-10-CM

## 2022-02-08 NOTE — Patient Instructions (Addendum)
It was great seeing you today!  Plan discussed at today's visit: -Blood work ordered today, results will be uploaded to [facility].  -Testicular ultrasound ordered today -Referral to social work placed today as well   Follow up in: 6 months or sooner as needed  Take care and let us know if you have any questions or concerns prior to your next visit.  Dr. Rosana Berger  Low-Purine Eating Plan A low-purine eating plan involves making food choices to limit your purine intake. Purine is a kind of uric acid. Too much uric acid in your blood can cause certain conditions, such as gout and kidney stones. Eating a low-purine diet may help control these conditions. What are tips for following this plan? Shopping Avoid buying products that contain high-fructose corn syrup. Check for this on food labels. It is commonly found in many processed foods and soft drinks. Be sure to check for it in baked goods such as cookies, canned fruits, and cereals and cereal bars. Avoid buying veal, chicken breast with skin, lamb, and organ meats such as liver. These types of meats tend to have the highest purine content. Choose dairy products. These may lower uric acid levels. Avoid certain types of fish. Not all fish and seafood have high purine content. Examples with high purine content include anchovies, trout, tuna, sardines, and salmon. Avoid buying beverages that contain alcohol, particularly beer and hard liquor. Alcohol can affect the way your body gets rid of uric acid. Meal planning  Learn which foods do or do not affect you. If you find out that a food tends to cause your gout symptoms to flare up, avoid eating that food. You can enjoy foods that do not cause problems. If you have any questions about a food item, talk with your dietitian or health care provider. Reduce the overall amount of meat in your diet. When you do eat meat, choose ones with lower purine content. Include plenty of fruits and vegetables.  Although some vegetables may have a high purine content--such as asparagus, mushrooms, spinach, or cauliflower--it has been shown that these do not contribute to uric acid blood levels as much. Consume at least 1 dairy serving a day. This has been shown to decrease uric acid levels. General information If you drink alcohol: Limit how much you have to: 0-1 drink a day for women who are not pregnant. 0-2 drinks a day for men. Know how much alcohol is in a drink. In the [place] one drink equals one 12 oz bottle of beer (355 mL), one 5 oz glass of wine (148 mL), or one 1 oz glass of hard liquor (44 mL). Drink plenty of water. Try to drink enough to keep your urine pale yellow. Fluids can help remove uric acid from your body. Work with your health care provider and dietitian to develop a plan to achieve or maintain a healthy weight. Losing weight may help reduce uric acid in your blood. What foods are recommended? The following are some types of foods that are good choices when limiting purine intake: Fresh or frozen fruits and vegetables. Whole grains, breads, cereals, and pasta. Rice. Beans, peas, legumes. Nuts and seeds. Dairy products. Fats and oils. The items listed above may not be a complete list. Talk with a dietitian about what dietary choices are best for you. What foods are not recommended? Limit your intake of foods high in purines, including: Beer and other alcohol. Meat-based gravy or sauce. Canned or fresh fish, such as: Anchovies, sardines,  herring, salmon, and tuna. Mussels and scallops. Codfish, trout, and haddock. Bacon, veal, chicken breast with skin, and lamb. Organ meats, such as: Liver or kidney. Tripe. Sweetbreads (thymus gland or pancreas). Wild Education officer, environmental. Yeast or yeast extract supplements. Drinks sweetened with high-fructose corn syrup, such as soda. Processed foods made with high-fructose corn syrup. The items listed above may not be a complete list of  foods and beverages you should limit. Contact a dietitian for more information. Summary Eating a low-purine diet may help control conditions caused by too much uric acid in the body, such as gout or kidney stones. Choose low-purine foods, limit alcohol, and limit high-fructose corn syrup. You will learn over time which foods do or do not affect you. If you find out that a food tends to cause your gout symptoms to flare up, avoid eating that food. This information is not intended to replace advice given to you by your health care provider. Make sure you discuss any questions you have with your health care provider. Document Revised: 04/09/2021 Document Reviewed: 04/09/2021 Elsevier Patient Education  2023 [employer].

## 2022-02-08 NOTE — Progress Notes (Signed)
New Patient Office Visit  Subjective    Patient ID: Randall Stanley, male    DOB: 1980/05/16  Age: 41 y.o. MRN: [id number]  CC:  Chief Complaint  Patient presents with   Establish Care   Groin Swelling    Wife felt lump right   anger issue    HPI Randall Stanley presents to establish care. He has not had a health care provider in several years.   History of Gout:  -First diagnosed about 8 years ago with flare in his right great toe -Last flare: 2 weeks ago but he ate more seafood than normal which is regular trigger for him. Drank more water and cherry juice and symptoms resolved. -Has a flare about once a year sometimes less often -Not currently on any medications for gout  History of Pre-Diabetes: -Last A1c in 2019 5.9%  Testicular Lesion: -Right testicular mass - wife first noticed it about 1 year ago -Hard and firm in nature - occasionally painful with sexual activity or standing but not associated with anything specific -Pain is pulsating in nature -Denies fevers, urinary changes, skin changes   Posterior Cervical Lymphadenopathy: -Enlarged posterior cervical lymph node on the right first diagnosed several years ago -Had been seeing ENT and was planning a surgical procedure, however patient could not afford at the time and now is no longer seeing ENT -Does have consistent pain associated with it -Had been on muscle relaxers and topical medication in the past CT soft tissue of the neck with contrast from 02/08/2020 showing subcutaneous lymph node in right posterior lateral neck measuring 7.8 mm with a fatty hilum and appears to be benign.  No changes since 2018.  No pathologically enlarged lymph nodes in the neck with bilateral tonsillar hypertrophy.  Stress/Anxiety: -Sometimes feels more stress/quick to anger, especially at work. He works in Production designer, theatre/television/film. He does like to be alone after work to [employer], he has a fish tank that helps with stress relief as well. -He  is not interested in medications but would be agreeable to counseling  -Mood status: stable -Current treatment: Nothing      02/08/2022    9:09 AM  Depression screen PHQ 2/9  Decreased Interest 0  Down, Depressed, Hopeless 0  PHQ - 2 Score 0  Altered sleeping 0  Tired, decreased energy 0  Change in appetite 0  Feeling bad or failure about yourself  0  Trouble concentrating 0  Moving slowly or fidgety/restless 0  Suicidal thoughts 0  PHQ-9 Score 0  Difficult doing work/chores Not difficult at all   Health Maintenance: -Blood work due  Outpatient Encounter Medications as of 02/08/2022  Medication Sig   [DISCONTINUED] albuterol (VENTOLIN HFA) 108 (90 Base) MCG/ACT inhaler Inhale 2 puffs into the lungs every 4 (four) hours as needed for wheezing or shortness of breath.   [DISCONTINUED] azithromycin (ZITHROMAX) 250 MG tablet Take 1 tablet (250 mg total) by mouth daily. Take first 2 tablets together, then 1 every day until finished.   [DISCONTINUED] erythromycin ophthalmic ointment Place 1 application into both eyes 4 (four) times daily.   [DISCONTINUED] predniSONE (DELTASONE) 20 MG tablet Take 1 tablet (20 mg total) by mouth daily with breakfast.   No facility-administered encounter medications on file as of 02/08/2022.    Past Medical History:  Diagnosis Date   Chicken pox    Gout    History of kidney stones    Scarlet fever     Past Surgical History:  Procedure  Laterality Date   CARPAL TUNNEL WITH CUBITAL TUNNEL Right 06/01/2019   Procedure: RIGHT CARPAL TUNNEL AND RIGHT ULNAR NERVE DECOMPRESSION AT RIGHT ELBOW;  Surgeon: Leanora Cover, MD;  Location: [place];  Service: Orthopedics;  Laterality: Right;  hand and upper arm    Family History  Problem Relation Age of Onset   Parkinson's disease Maternal Grandmother    Hyperlipidemia Father    Stroke Father    Rheumatic fever Father    Heart attack Father    Kidney failure Father    Diabetes Father     COPD Mother     Social History   Socioeconomic History   Marital status: Married    Spouse name: Not on file   Number of children: Not on file   Years of education: Not on file   Highest education level: Not on file  Occupational History   Not on file  Tobacco Use   Smoking status: Never   Smokeless tobacco: Never  Vaping Use   Vaping Use: Never used  Substance and Sexual Activity   Alcohol use: Yes    Alcohol/week: 0.0 standard drinks of alcohol    Comment: occasional   Drug use: No   Sexual activity: Yes  Other Topics Concern   Not on file  Social History Narrative   Married.   2 children   Works as a Diplomatic Services operational officer.   Enjoys gardening   Social Determinants of Radio broadcast assistant Strain: Not on file  Food Insecurity: Not on file  Transportation Needs: Not on file  Physical Activity: Not on file  Stress: Not on file  Social Connections: Not on file  Intimate Partner Violence: Not on file    Review of Systems  Constitutional:  Negative for chills and fever.  Eyes:  Negative for blurred vision.  Respiratory:  Negative for shortness of breath.   Cardiovascular:  Negative for chest pain.  Gastrointestinal:  Negative for abdominal pain.  Genitourinary:  Negative for dysuria, flank pain, frequency, hematuria and urgency.  Musculoskeletal:  Positive for neck pain.        Objective    BP 122/82   Pulse 69   Temp 98.3 F (36.8 C)   Resp 18   Ht 5\' 9"  (1.753 m)   Wt 241 lb 1.6 oz (109.4 kg)   SpO2 96%   BMI 35.60 kg/m   Physical Exam Exam conducted with a chaperone present.  Constitutional:      Appearance: Normal appearance.  HENT:     Head: Normocephalic and atraumatic.  Eyes:     Conjunctiva/sclera: Conjunctivae normal.  Neck:     Comments: Large palpable posterior cervical lymph node enlarged, non-tender.  Freely mobile. Cardiovascular:     Rate and Rhythm: Normal rate and regular rhythm.  Pulmonary:     Effort: Pulmonary effort  is normal.     Breath sounds: Normal breath sounds.  Genitourinary:    Penis: Normal.      Testes: Normal.  Musculoskeletal:     Right lower leg: No edema.     Left lower leg: No edema.  Lymphadenopathy:     Cervical: Cervical adenopathy present.  Skin:    General: Skin is warm and dry.  Neurological:     General: No focal deficit present.     Mental Status: He is alert. Mental status is at baseline.  Psychiatric:        Mood and Affect: Mood normal.  Behavior: Behavior normal.         Assessment & Plan:   1. Testicular lesion: Unable to palpate on exam, however patient and his wife have both felt a hard lesion on the right testicle in the past.  We will obtain ultrasound today.  - CBC w/Diff/Platelet - US Scrotum; Future  2. Posterior cervical lymphadenopathy: Chronic.  Patient does have some discomfort associated with this enlarged lymph node, however this is unchanged.  Reviewed CT scan from October 2021.  Consider repeat ultrasound in the future if symptoms change.  3. History of gout: Last flare about 2 weeks ago, however only having flares about once a year.  We will obtain a uric acid level today.  Discussed low purine eating plan, which was printed and given to the patient.  - Uric acid  4. History of prediabetes: Due for routine screening labs such as CBC, CMP as well as recheck A1c today.  - CBC w/Diff/Platelet - COMPLETE METABOLIC PANEL WITH GFR - HgB T2I  5. Anxiety: The patient has work related stress and can be quick to anger.  Patient not interested in medications, discussed starting therapy/counseling.  Referral to social work to help facilitate this was ordered today.  - AMB Referral to Chronic Care Management Services  6. Lipid screening/ Encounter for hepatitis C screening test for low risk patient/Screening for HIV without presence of risk factors: Screening labs ordered today.  - Lipid Profile - Hepatitis C Antibody - HIV antibody (with  reflex)   Return in about 6 months (around 08/10/2022).   Margarita Mail, DO

## 2022-02-09 LAB — COMPLETE METABOLIC PANEL WITH GFR
AG Ratio: 1.4 (calc) (ref 1.0–2.5)
ALT: 39 U/L (ref 9–46)
AST: 30 U/L (ref 10–40)
Albumin: 4.5 g/dL (ref 3.6–5.1)
Alkaline phosphatase (APISO): 47 U/L (ref 36–130)
BUN: 13 mg/dL (ref 7–25)
CO2: 29 mmol/L (ref 20–32)
Calcium: 9.3 mg/dL (ref 8.6–10.3)
Chloride: 102 mmol/L (ref 98–110)
Creat: 0.98 mg/dL (ref 0.60–1.29)
Globulin: 3.3 g/dL (calc) (ref 1.9–3.7)
Glucose, Bld: 93 mg/dL (ref 65–139)
Potassium: 4.3 mmol/L (ref 3.5–5.3)
Sodium: 139 mmol/L (ref 135–146)
Total Bilirubin: 0.5 mg/dL (ref 0.2–1.2)
Total Protein: 7.8 g/dL (ref 6.1–8.1)
eGFR: 99 mL/min/{1.73_m2} (ref >=60)

## 2022-02-09 LAB — HIV ANTIBODY (ROUTINE TESTING W REFLEX): HIV 1&2 Ab, 4th Generation: NONREACTIVE

## 2022-02-09 LAB — CBC WITH DIFFERENTIAL/PLATELET
Absolute Monocytes: 662 cells/uL (ref 200–950)
Basophils Absolute: 50 cells/uL (ref 0–200)
Basophils Relative: 0.8 %
Eosinophils Absolute: 164 cells/uL (ref 15–500)
Eosinophils Relative: 2.6 %
HCT: 49.2 % (ref 38.5–50.0)
Hemoglobin: 16.6 g/dL (ref 13.2–17.1)
Lymphs Abs: 1877 cells/uL (ref 850–3900)
MCH: 29.7 pg (ref 27.0–33.0)
MCHC: 33.7 g/dL (ref 32.0–36.0)
MCV: 88.2 fL (ref 80.0–100.0)
MPV: 9.1 fL (ref 7.5–12.5)
Monocytes Relative: 10.5 %
Neutro Abs: 3547 cells/uL (ref 1500–7800)
Neutrophils Relative %: 56.3 %
Platelets: 250 10*3/uL (ref 140–400)
RBC: 5.58 10*6/uL (ref 4.20–5.80)
RDW: 12.7 % (ref 11.0–15.0)
Total Lymphocyte: 29.8 %
WBC: 6.3 10*3/uL (ref 3.8–10.8)

## 2022-02-09 LAB — LIPID PANEL
Cholesterol: 124 mg/dL (ref <200)
HDL: 37 mg/dL — ABNORMAL LOW (ref >=40)
LDL Cholesterol (Calc): 70 mg/dL (calc)
Non-HDL Cholesterol (Calc): 87 mg/dL (calc) (ref <130)
Total CHOL/HDL Ratio: 3.4 (calc) (ref <5.0)
Triglycerides: 90 mg/dL (ref <150)

## 2022-02-09 LAB — HEMOGLOBIN A1C
Hgb A1c MFr Bld: 5.7 % of total Hgb — ABNORMAL HIGH (ref <5.7)
Mean Plasma Glucose: 117 mg/dL
eAG (mmol/L): 6.5 mmol/L

## 2022-02-09 LAB — URIC ACID: Uric Acid, Serum: 7 mg/dL (ref 4.0–8.0)

## 2022-02-09 LAB — HEPATITIS C ANTIBODY: Hepatitis C Ab: NONREACTIVE

## 2022-02-15 ENCOUNTER — Ambulatory Visit
Admission: RE | Admit: 2022-02-15 | Discharge: 2022-02-15 | Disposition: A | Discharge status: Home / Self Care | Payer: BC Managed Care – PPO | Source: Ambulatory Visit | Attending: Internal Medicine | Admitting: Internal Medicine

## 2022-02-15 DIAGNOSIS — N509 Disorder of male genital organs, unspecified: Secondary | ICD-10-CM | POA: Insufficient documentation

## 2022-02-15 DIAGNOSIS — N5089 Other specified disorders of the male genital organs: Secondary | ICD-10-CM | POA: Diagnosis not present

## 2022-03-19 ENCOUNTER — Ambulatory Visit (INDEPENDENT_AMBULATORY_CARE_PROVIDER_SITE_OTHER): Payer: BC Managed Care – PPO | Admitting: Family Medicine

## 2022-03-19 ENCOUNTER — Encounter: Payer: Self-pay | Admitting: Family Medicine

## 2022-03-19 VITALS — BP 130/78 | HR 99 | Temp 98.5°F | Resp 16 | Ht 69.0 in | Wt 240.5 lb

## 2022-03-19 DIAGNOSIS — R112 Nausea with vomiting, unspecified: Secondary | ICD-10-CM

## 2022-03-19 MED ORDER — ONDANSETRON 4 MG PO TBDP: 4.0000 mg | ORAL_TABLET | Freq: Three times a day (TID) | ORAL | 1 refills | Status: DC | PRN

## 2022-03-19 NOTE — Patient Instructions (Signed)
Viral Gastroenteritis, Adult  Viral gastroenteritis is also known as the stomach flu. This condition may affect your stomach, your small intestine, and your large intestine. It can cause sudden watery poop (diarrhea), fever, and vomiting. This condition is caused by certain germs (viruses). These germs can be passed from person to person very easily (are contagious). Having watery poop and vomiting can make you feel weak and cause you to not have enough water in your body (get dehydrated). This can make you tired and thirsty, make you have a dry mouth, and make it so you pee (urinate) less often. It is important to replace the fluids that you lose from having watery poop and vomiting. What are the causes? You can get sick by catching germs from other people. You can also get sick by: Eating food, drinking water, or touching a surface that has the germs on it (is contaminated). Sharing utensils or other personal items with a person who is sick. What increases the risk? Having a weak body defense system (immune system). Living with one or more children who are younger than 2 years. Living in a nursing home. Going on cruise ships. What are the signs or symptoms? Symptoms of this condition start suddenly. Symptoms may last for a few days or for as long as a week. Common symptoms include: Watery poop. Vomiting. Other symptoms include: Fever. Headache. Feeling tired (fatigue). Pain in the belly (abdomen). Chills. Feeling weak. Feeling like you may vomit (nauseous). Muscle aches. Not feeling hungry. How is this treated? This condition typically goes away on its own. The focus of treatment is to replace the fluids that you lose. This condition may be treated with: An ORS (oral rehydration solution). This is a drink that helps you replace fluids and minerals your body lost. It is sold at pharmacies and stores. Medicines to help with your symptoms. Probiotic supplements to reduce symptoms of  watery poop. Fluids given through an IV tube, if needed. Older adults and people with other diseases or a weak body defense system are at higher risk for not having enough water in the body. Follow these instructions at home: Eating and drinking  Take an ORS as told by your doctor. Drink clear fluids in small amounts as you are able. Clear fluids include: Water. Ice chips. Fruit juice that has water added to it (is diluted). Low-calorie sports drinks. Drink enough fluid to keep your pee (urine) pale yellow. Eat small amounts of healthy foods every 3-4 hours as you are able. This may include whole grains, fruits, vegetables, lean meats, and yogurt. Avoid fluids that have a lot of sugar or caffeine in them. This includes energy drinks, sports drinks, and soda. Avoid spicy or fatty foods. Avoid alcohol. General instructions  Wash your hands often. This is very important after you have watery poop or you vomit. If you cannot use soap and water, use hand sanitizer. Make sure that all people in your home wash their hands well and often. Take over-the-counter and prescription medicines only as told by your doctor. Rest at home while you get better. Watch your condition for any changes. Take a warm bath to help with any burning or pain from having watery poop. Keep all follow-up visits. Contact a doctor if: You cannot keep fluids down. Your symptoms get worse. You have new symptoms. You feel light-headed or dizzy. You have muscle cramps. Get help right away if: You have chest pain. You have trouble breathing, or you are breathing very fast.   You have a fast heartbeat. You feel very weak or you faint. You have a very bad headache, a stiff neck, or both. You have a rash. You have very bad pain, cramping, or bloating in your belly. Your skin feels cold and clammy. You feel mixed up (confused). You have pain when you pee. You have signs of not having enough water in the body, such  as: Dark pee, hardly any pee, or no pee. Cracked lips. Dry mouth. Sunken eyes. Feeling very sleepy. Feeling weak. You have signs of bleeding, such as: You see blood in your vomit. Your vomit looks like coffee grounds. You have bloody or black poop or poop that looks like tar. These symptoms may be an emergency. Get help right away. Call 911. Do not wait to see if the symptoms will go away. Do not drive yourself to the hospital. Summary Viral gastroenteritis is also known as the stomach flu. This condition can cause sudden watery poop (diarrhea), fever, and vomiting. These germs can be passed from person to person very easily. Take an ORS (oral rehydration solution) as told by your doctor. This is a drink that is sold at pharmacies and stores. Wash your hands often, especially after having watery poop or vomiting. If you cannot use soap and water, use hand sanitizer. This information is not intended to replace advice given to you by your health care provider. Make sure you discuss any questions you have with your health care provider. Document Revised: 02/23/2021 Document Reviewed: 02/23/2021 Elsevier Patient Education  2023 Elsevier Inc.  

## 2022-03-19 NOTE — Progress Notes (Signed)
Patient ID: Randall Stanley, male    DOB: 1980/10/03, 41 y.o.   MRN: [id number]  PCP: Teodora Medici, DO  Chief Complaint  Patient presents with   Generalized Body Aches   Emesis    Sx started right after lunch, pt states at home everyone has been sick but different sx. Everyone has tested for COVID but Negative    Subjective:   Randall Stanley is a 41 y.o. male, presents to clinic with CC of the following:  HPI  Pt with mild URI sx for a day then N/V that started today at around lunchtime, hasn't eaten or drinken since, stomach is a little upset and queezy Loose stool yesterday No abd pain, fever, sweats Did feel some body ache in the last day Multiple family members at home sick with different things, many tested for covid, neg home and doc office testing  Patient Active Problem List   Diagnosis Date Noted   Posterior cervical lymphadenopathy 02/08/2022   History of prediabetes 02/08/2022   History of gout 02/08/2022   Anxiety 02/08/2022   Preventative health care 12/11/2015   GERD (gastroesophageal reflux disease) 09/05/2015   Gout 09/05/2015     No current outpatient medications on file.   No Known Allergies   Social History   Tobacco Use   Smoking status: Never   Smokeless tobacco: Never  Vaping Use   Vaping Use: Never used  Substance Use Topics   Alcohol use: Yes    Alcohol/week: 0.0 standard drinks of alcohol    Comment: occasional   Drug use: No      Chart Review Today: I personally reviewed active problem list, medication list, allergies, family history, social history, health maintenance, notes from last encounter, lab results, imaging with the patient/caregiver today.   Review of Systems  Constitutional: Negative.   HENT: Negative.    Eyes: Negative.   Respiratory: Negative.    Cardiovascular: Negative.   Gastrointestinal: Negative.   Endocrine: Negative.   Genitourinary: Negative.   Musculoskeletal: Negative.   Skin: Negative.    Allergic/Immunologic: Negative.   Neurological: Negative.   Hematological: Negative.   Psychiatric/Behavioral: Negative.    All other systems reviewed and are negative.      Objective:   Vitals:   03/19/22 1527  BP: 130/78  Pulse: 99  Resp: 16  Temp: 98.5 F (36.9 C)  TempSrc: Oral  SpO2: 97%  Weight: 240 lb 8 oz (109.1 kg)  Height: _0  (1.753 m)    Body mass index is 35.52 kg/m.  Physical Exam Vitals and nursing note reviewed.  Constitutional:      General: He is not in acute distress.    Appearance: Normal appearance. He is well-developed. He is obese. He is not ill-appearing, toxic-appearing or diaphoretic.  HENT:     Head: Normocephalic and atraumatic.     Right Ear: External ear normal.     Left Ear: External ear normal.     Nose: Congestion and rhinorrhea present.     Mouth/Throat:     Mouth: Mucous membranes are moist.     Pharynx: Oropharynx is clear. No oropharyngeal exudate or posterior oropharyngeal erythema.  Eyes:     General: No scleral icterus.       Right eye: No discharge.        Left eye: No discharge.     Conjunctiva/sclera: Conjunctivae normal.  Neck:     Trachea: No tracheal deviation.  Cardiovascular:     Rate and  Rhythm: Normal rate and regular rhythm.     Pulses: Normal pulses.     Heart sounds: Normal heart sounds. No murmur heard.    No friction rub. No gallop.  Pulmonary:     Effort: Pulmonary effort is normal. No respiratory distress.     Breath sounds: Normal breath sounds. No stridor. No wheezing, rhonchi or rales.  Abdominal:     General: Bowel sounds are normal. There is no distension.     Palpations: Abdomen is soft.     Tenderness: There is no abdominal tenderness. There is no guarding or rebound.  Skin:    General: Skin is warm and dry.     Findings: No rash.  Neurological:     Mental Status: He is alert.     Motor: No abnormal muscle tone.     Coordination: Coordination normal.  Psychiatric:        Mood and  Affect: Mood normal.        Behavior: Behavior normal.      Results for orders placed or performed in visit on 02/08/22  Hepatitis C Antibody  Result Value Ref Range   Hepatitis C Ab NON-REACTIVE NON-REACTIVE  HIV antibody (with reflex)  Result Value Ref Range   HIV 1&2 Ab, 4th Generation NON-REACTIVE NON-REACTIVE  CBC w/Diff/Platelet  Result Value Ref Range   WBC 6.3 3.8 - 10.8 Thousand/uL   RBC 5.58 4.20 - 5.80 Million/uL   Hemoglobin 16.6 13.2 - 17.1 g/dL   HCT 49.2 38.5 - 50.0 %   MCV 88.2 80.0 - 100.0 fL   MCH 29.7 27.0 - 33.0 pg   MCHC 33.7 32.0 - 36.0 g/dL   RDW 12.7 11.0 - 15.0 %   Platelets 250 140 - 400 Thousand/uL   MPV 9.1 7.5 - 12.5 fL   Neutro Abs 3,547 1,500 - 7,800 cells/uL   Lymphs Abs 1,877 850 - 3,900 cells/uL   Absolute Monocytes 662 200 - 950 cells/uL   Eosinophils Absolute 164 15 - 500 cells/uL   Basophils Absolute 50 0 - 200 cells/uL   Neutrophils Relative % 56.3 %   Total Lymphocyte 29.8 %   Monocytes Relative 10.5 %   Eosinophils Relative 2.6 %   Basophils Relative 0.8 %  COMPLETE METABOLIC PANEL WITH GFR  Result Value Ref Range   Glucose, Bld 93 65 - 139 mg/dL   BUN 13 7 - 25 mg/dL   Creat 0.98 0.60 - 1.29 mg/dL   eGFR 99 > OR = 60 mL/min/1.42m   BUN/Creatinine Ratio SEE NOTE: 6 - 22 (calc)   Sodium 139 135 - 146 mmol/L   Potassium 4.3 3.5 - 5.3 mmol/L   Chloride 102 98 - 110 mmol/L   CO2 29 20 - 32 mmol/L   Calcium 9.3 8.6 - 10.3 mg/dL   Total Protein 7.8 6.1 - 8.1 g/dL   Albumin 4.5 3.6 - 5.1 g/dL   Globulin 3.3 1.9 - 3.7 g/dL (calc)   AG Ratio 1.4 1.0 - 2.5 (calc)   Total Bilirubin 0.5 0.2 - 1.2 mg/dL   Alkaline phosphatase (APISO) 47 36 - 130 U/L   AST 30 10 - 40 U/L   ALT 39 9 - 46 U/L  Lipid Profile  Result Value Ref Range   Cholesterol 124 <200 mg/dL   HDL 37 (L) > OR = 40 mg/dL   Triglycerides 90 <150 mg/dL   LDL Cholesterol (Calc) 70 mg/dL (calc)   Total CHOL/HDL Ratio 3.4 <5.0 (calc)   Non-HDL  Cholesterol (Calc) 87 <130  mg/dL (calc)  Uric acid  Result Value Ref Range   Uric Acid, Serum 7.0 4.0 - 8.0 mg/dL  HgB A1c  Result Value Ref Range   Hgb A1c MFr Bld 5.7 (H) <5.7 % of total Hgb   Mean Plasma Glucose 117 mg/dL   eAG (mmol/L) 6.5 mmol/L       Assessment & Plan:   1. Nausea and vomiting, unspecified vomiting type Suspect viral gastroenteritis - discussed antiemetics, pushing clear fluids, can also try pepcid for upset stomach/nausea/indigestion, slow advance of diet No return to work until 24 hours fever/vomiting/diarrhea free - work note given today Pt had unremarkable abd exam, VSS, non-toxic appearing, supportive and sx measures - ondansetron (ZOFRAN-ODT) 4 MG disintegrating tablet; Take 1-2 tablets (4-8 mg total) by mouth every 8 (eight) hours as needed for nausea.  Dispense: 20 tablet; Refill: 1  F/up as needed  Excused from work through the weekend    Delsa Grana, Hershal Coria 03/19/22 3:49 PM

## 2022-03-30 ENCOUNTER — Telehealth: Payer: Self-pay | Admitting: *Deleted

## 2022-03-30 NOTE — Progress Notes (Signed)
  Care Coordination  Outreach Note  03/30/2022 Name: ERROL ALA MRN: [id number] DOB: 1980/09/11   Care Coordination Outreach Attempts: An unsuccessful telephone outreach was attempted today to offer the patient information about available care coordination services as a benefit of their health plan.   Referral received   Follow Up Plan:  Additional outreach attempts will be made to offer the patient care coordination information and services.   Encounter Outcome:  No Answer  Burman Nieves, CCMA Care Coordination Care Guide Direct Dial: [phone]

## 2022-04-06 NOTE — Progress Notes (Signed)
  Care Coordination  Outreach Note  04/06/2022 Name: Randall Stanley MRN: [id number] DOB: 10/10/1980   Care Coordination Outreach Attempts: A second unsuccessful outreach was attempted today to offer the patient with information about available care coordination services as a benefit of their health plan.     Referral received   Follow Up Plan:  Additional outreach attempts will be made to offer the patient care coordination information and services.   Encounter Outcome:  No Answer  Burman Nieves, CCMA Care Coordination Care Guide Direct Dial: [phone]

## 2022-04-09 NOTE — Progress Notes (Signed)
  Care Coordination  Outreach Note  04/09/2022 Name: Randall Stanley MRN: [id number] DOB: 05-20-1980   Care Coordination Outreach Attempts: A third unsuccessful outreach was attempted today to offer the patient with information about available care coordination services as a benefit of their health plan.   Referral received   Follow Up Plan:  No further outreach attempts will be made at this time. We have been unable to contact the patient to offer or enroll patient in care coordination services  Encounter Outcome:  No Answer  Burman Nieves, [facility] Care Coordination Care Guide Direct Dial: [phone]

## 2022-05-21 IMAGING — DX DG CHEST 2V
2 series · 2 of 2 positions shown · non-contrast
Comparison: None.

CLINICAL DATA: Cough.

EXAM:
CHEST - 2 VIEW

[chest pa]
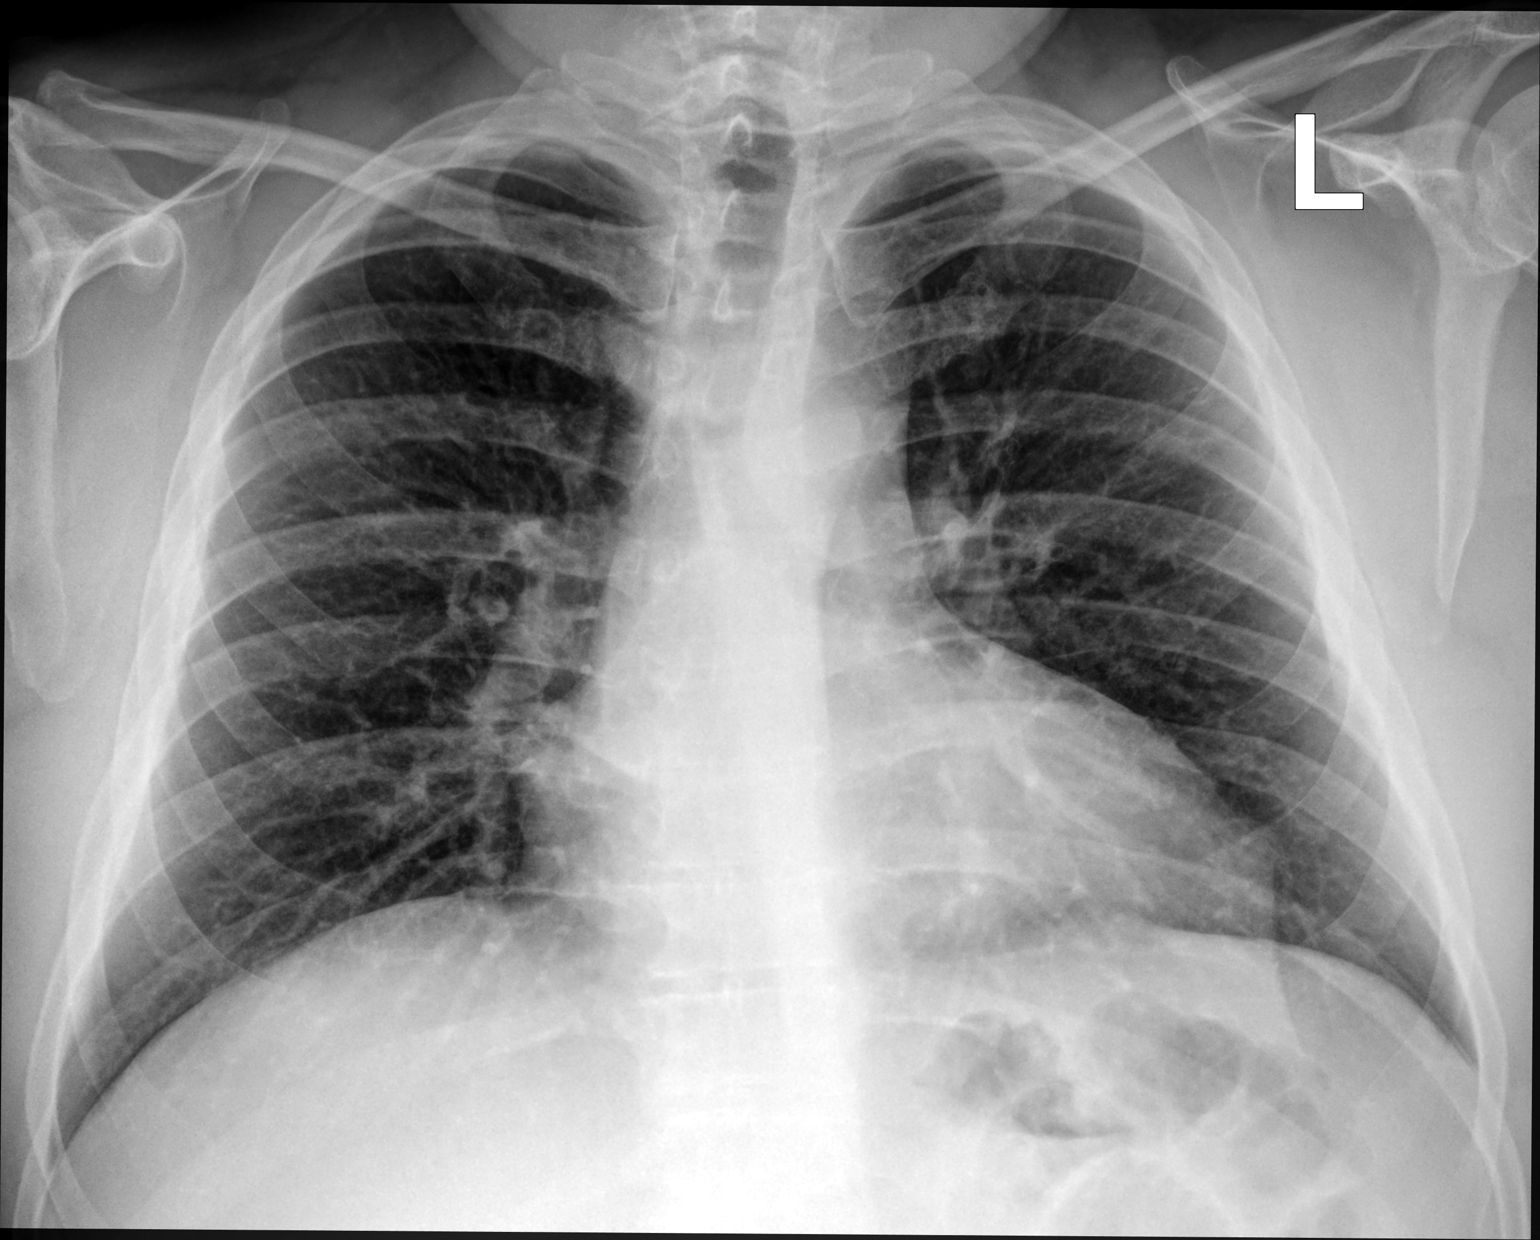

[chest lat]
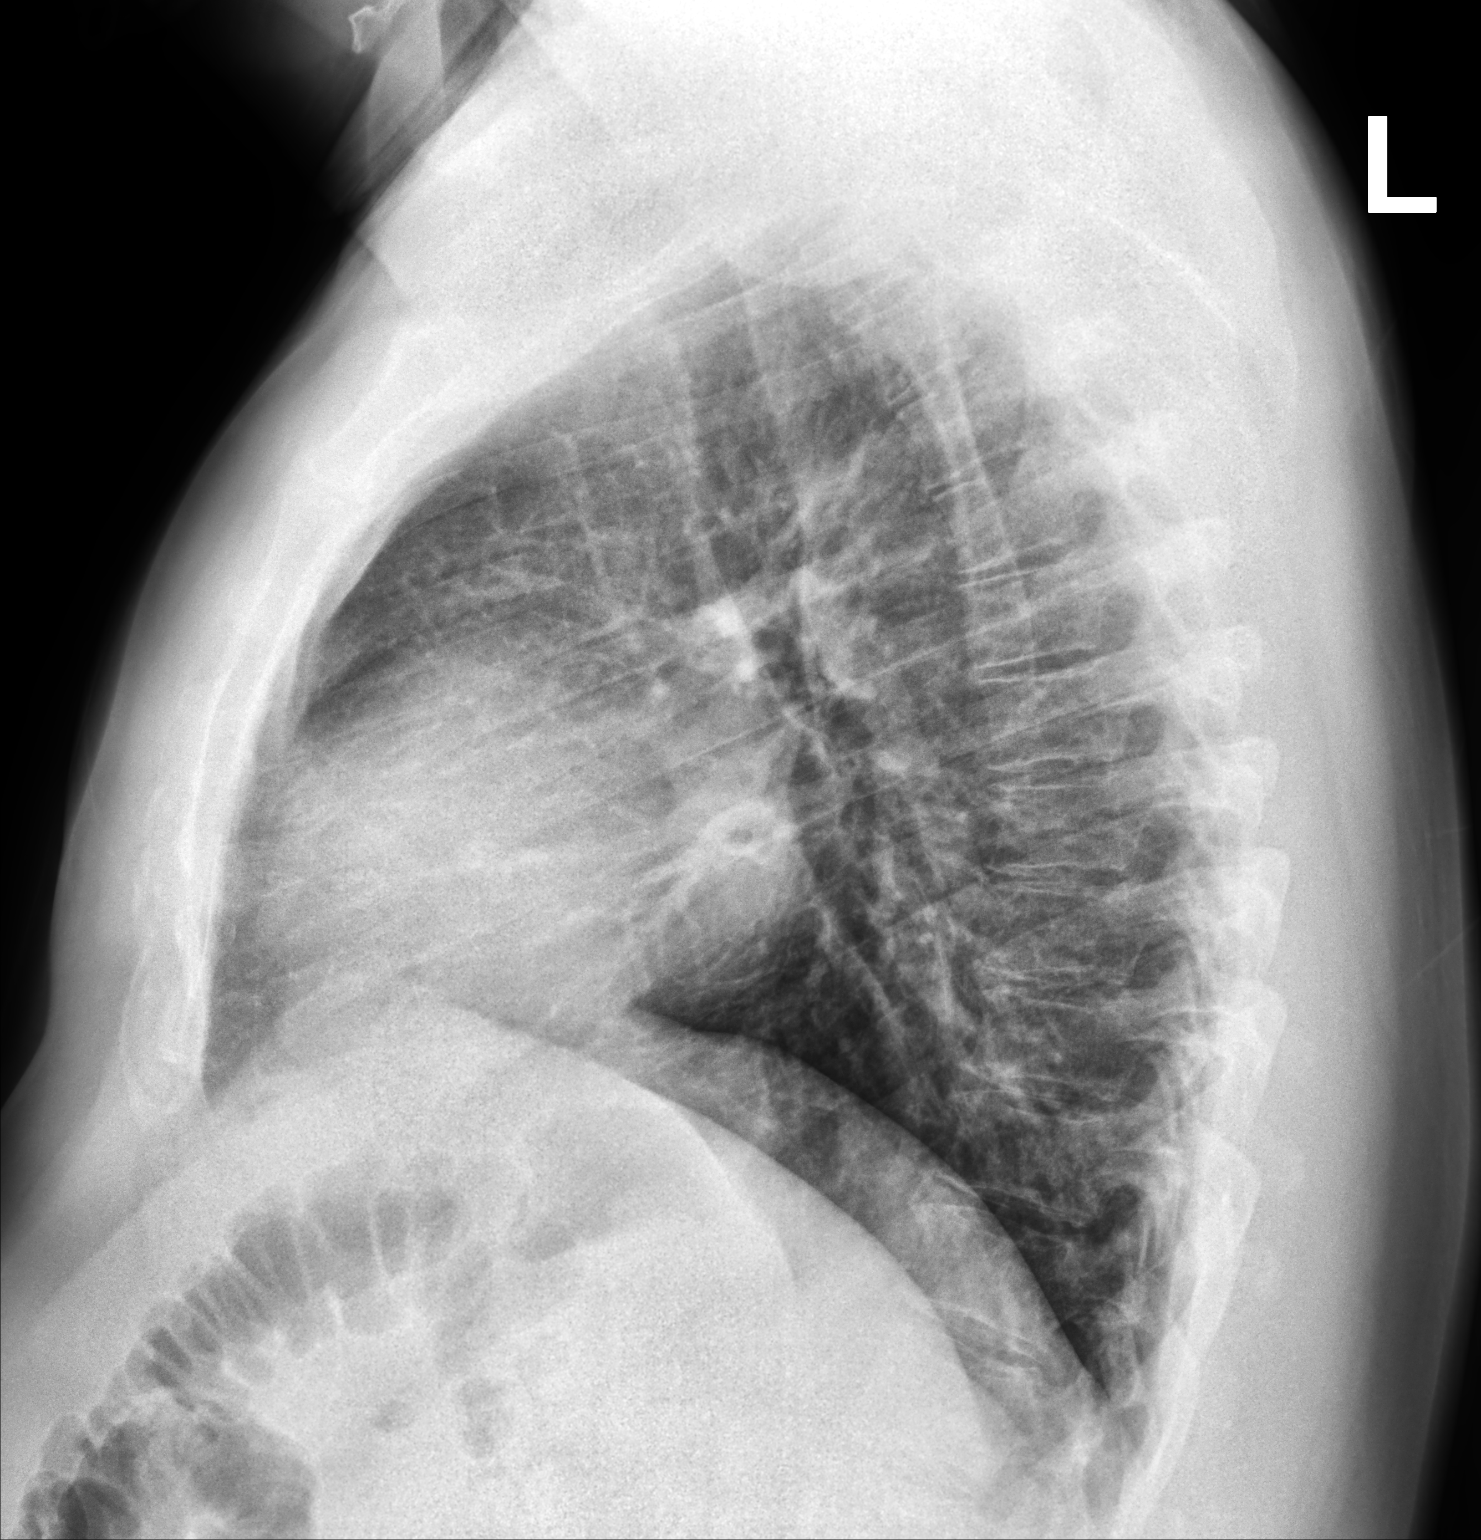

[2 of 2 positions shown; findings below may reference images not displayed]

FINDINGS: The heart size and mediastinal contours are within normal limits.
Both lungs are clear. The visualized skeletal structures are
unremarkable.
IMPRESSION: No active cardiopulmonary disease.

## 2022-08-11 NOTE — Progress Notes (Deleted)
Established Patient Office Visit  Subjective    Patient ID: Randall Stanley, male    DOB: 12/26/1980  Age: 42 y.o. MRN: [id number]  CC:  No chief complaint on file.   HPI [name] presents to follow up.   History of Gout:  -First diagnosed about 8 years ago with flare in his right great toe -Last flare: fall of 2023 but he ate more seafood than normal which is regular trigger for him. Drank more water and cherry juice and symptoms resolved. -Has a flare about once a year sometimes less often -Not currently on any medications for gout -Last uric acid normal 10/23 at 7.0  History of Pre-Diabetes: -Last A1c 5.7% 10/23  Testicular Lesion: -Right testicular mass - wife first noticed it about 1 year ago -Hard and firm in nature - occasionally painful with sexual activity or standing but not associated with anything specific -Pain is pulsating in nature -Denies fevers, urinary changes, skin changes  -Scrotal US 02/15/22 negative   Posterior Cervical Lymphadenopathy: -Enlarged posterior cervical lymph node on the right first diagnosed several years ago -Had been seeing ENT and was planning a surgical procedure, however patient could not afford at the time and now is no longer seeing ENT -Does have consistent pain associated with it -Had been on muscle relaxers and topical medication in the past CT soft tissue of the neck with contrast from 02/08/2020 showing subcutaneous lymph node in right posterior lateral neck measuring 7.8 mm with a fatty hilum and appears to be benign.  No changes since 2018.  No pathologically enlarged lymph nodes in the neck with bilateral tonsillar hypertrophy.  Stress/Anxiety: -Sometimes feels more stress/quick to anger, especially at work. He works in Theatre manager. He does like to be alone after work to [employer], he has a fish tank that helps with stress relief as well. -He is not interested in medications but would be agreeable to counseling   -Mood status: stable -Current treatment: Nothing      03/19/2022    3:27 PM 02/08/2022    9:09 AM  Depression screen PHQ 2/9  Decreased Interest 0 0  Down, Depressed, Hopeless 0 0  PHQ - 2 Score 0 0  Altered sleeping 0 0  Tired, decreased energy 0 0  Change in appetite 0 0  Feeling bad or failure about yourself  0 0  Trouble concentrating 0 0  Moving slowly or fidgety/restless 0 0  Suicidal thoughts 0 0  PHQ-9 Score 0 0  Difficult doing work/chores Not difficult at all Not difficult at all   Health Maintenance: -Blood work due  Outpatient Encounter Medications as of 08/12/2022  Medication Sig   ondansetron (ZOFRAN-ODT) 4 MG disintegrating tablet Take 1-2 tablets (4-8 mg total) by mouth every 8 (eight) hours as needed for nausea.   No facility-administered encounter medications on file as of 08/12/2022.    Past Medical History:  Diagnosis Date   Chicken pox    Gout    History of kidney stones    Scarlet fever     Past Surgical History:  Procedure Laterality Date   CARPAL TUNNEL WITH CUBITAL TUNNEL Right 06/01/2019   Procedure: RIGHT CARPAL TUNNEL AND RIGHT ULNAR NERVE DECOMPRESSION AT RIGHT ELBOW;  Surgeon: Leanora Cover, MD;  Location: [facility];  Service: Orthopedics;  Laterality: Right;  hand and upper arm    Family History  Problem Relation Age of Onset   Parkinson's disease Maternal Grandmother    Hyperlipidemia  Father    Stroke Father    Rheumatic fever Father    Heart attack Father    Kidney failure Father    Diabetes Father    COPD Mother     Social History   Socioeconomic History   Marital status: Married    Spouse name: Not on file   Number of children: Not on file   Years of education: Not on file   Highest education level: Not on file  Occupational History   Not on file  Tobacco Use   Smoking status: Never   Smokeless tobacco: Never  Vaping Use   Vaping Use: Never used  Substance and Sexual Activity   Alcohol use: Yes     Alcohol/week: 0.0 standard drinks of alcohol    Comment: occasional   Drug use: No   Sexual activity: Yes  Other Topics Concern   Not on file  Social History Narrative   Married.   2 children   Works as a Diplomatic Services operational officer.   Enjoys gardening   Social Determinants of Radio broadcast assistant Strain: Not on file  Food Insecurity: Not on file  Transportation Needs: Not on file  Physical Activity: Not on file  Stress: Not on file  Social Connections: Not on file  Intimate Partner Violence: Not on file    Review of Systems  Constitutional:  Negative for chills and fever.  Eyes:  Negative for blurred vision.  Respiratory:  Negative for shortness of breath.   Cardiovascular:  Negative for chest pain.  Gastrointestinal:  Negative for abdominal pain.  Genitourinary:  Negative for dysuria, flank pain, frequency, hematuria and urgency.  Musculoskeletal:  Positive for neck pain.        Objective    There were no vitals taken for this visit.  Physical Exam Exam conducted with a chaperone present.  Constitutional:      Appearance: Normal appearance.  HENT:     Head: Normocephalic and atraumatic.  Eyes:     Conjunctiva/sclera: Conjunctivae normal.  Neck:     Comments: Large palpable posterior cervical lymph node enlarged, non-tender.  Freely mobile. Cardiovascular:     Rate and Rhythm: Normal rate and regular rhythm.  Pulmonary:     Effort: Pulmonary effort is normal.     Breath sounds: Normal breath sounds.  Genitourinary:    Penis: Normal.      Testes: Normal.  Musculoskeletal:     Right lower leg: No edema.     Left lower leg: No edema.  Lymphadenopathy:     Cervical: Cervical adenopathy present.  Skin:    General: Skin is warm and dry.  Neurological:     General: No focal deficit present.     Mental Status: He is alert. Mental status is at baseline.  Psychiatric:        Mood and Affect: Mood normal.        Behavior: Behavior normal.          Assessment & Plan:   1. Testicular lesion: Unable to palpate on exam, however patient and his wife have both felt a hard lesion on the right testicle in the past.  We will obtain ultrasound today.  - CBC w/Diff/Platelet - US Scrotum; Future  2. Posterior cervical lymphadenopathy: Chronic.  Patient does have some discomfort associated with this enlarged lymph node, however this is unchanged.  Reviewed CT scan from October 2021.  Consider repeat ultrasound in the future if symptoms change.  3. History of gout:  Last flare about 2 weeks ago, however only having flares about once a year.  We will obtain a uric acid level today.  Discussed low purine eating plan, which was printed and given to the patient.  - Uric acid  4. History of prediabetes: Due for routine screening labs such as CBC, CMP as well as recheck A1c today.  - CBC w/Diff/Platelet - COMPLETE METABOLIC PANEL WITH GFR - HgB A1c  5. Anxiety: The patient has work related stress and can be quick to anger.  Patient not interested in medications, discussed starting therapy/counseling.  Referral to social work to help facilitate this was ordered today.  - AMB Referral to Chronic Care Management Services  6. Lipid screening/ Encounter for hepatitis C screening test for low risk patient/Screening for HIV without presence of risk factors: Screening labs ordered today.  - Lipid Profile - Hepatitis C Antibody - HIV antibody (with reflex)   No follow-ups on file.   Teodora Medici, DO

## 2022-08-12 ENCOUNTER — Ambulatory Visit: Payer: BC Managed Care – PPO | Admitting: Internal Medicine

## 2022-09-24 ENCOUNTER — Emergency Department
Admission: EM | Admit: 2022-09-24 | Discharge: 2022-09-24 | Disposition: A | Discharge status: Home / Self Care | Payer: No Typology Code available for payment source | Attending: Emergency Medicine | Admitting: Emergency Medicine

## 2022-09-24 ENCOUNTER — Emergency Department: Payer: No Typology Code available for payment source

## 2022-09-24 ENCOUNTER — Other Ambulatory Visit: Payer: Self-pay

## 2022-09-24 DIAGNOSIS — Z1152 Encounter for screening for COVID-19: Secondary | ICD-10-CM | POA: Insufficient documentation

## 2022-09-24 DIAGNOSIS — J40 Bronchitis, not specified as acute or chronic: Secondary | ICD-10-CM | POA: Insufficient documentation

## 2022-09-24 DIAGNOSIS — R059 Cough, unspecified: Secondary | ICD-10-CM | POA: Diagnosis present

## 2022-09-24 LAB — SARS CORONAVIRUS 2 BY RT PCR: SARS Coronavirus 2 by RT PCR: NEGATIVE

## 2022-09-24 MED ORDER — PREDNISONE 10 MG (21) PO TBPK: ORAL_TABLET | ORAL | 0 refills | Status: DC

## 2022-09-24 MED ORDER — BENZONATATE 100 MG PO CAPS: 100.0000 mg | ORAL_CAPSULE | Freq: Three times a day (TID) | ORAL | 0 refills | Status: AC | PRN

## 2022-09-24 MED ORDER — AZITHROMYCIN 250 MG PO TABS: ORAL_TABLET | ORAL | 0 refills | Status: AC

## 2022-09-24 NOTE — ED Triage Notes (Signed)
Pt c/o cough and congestion for the last week, sts wife works in a hospital and brought home the sickness. Pt sts he has coughed to the point of vomiting. No obvious distress at this time.

## 2022-09-24 NOTE — Discharge Instructions (Signed)
Take tapered prednisone as directed. Take azithromycin as directed. You can take 2 Tessalon Perles at night before bed.

## 2022-09-24 NOTE — ED Provider Notes (Signed)
[facility] Provider Note  Patient Contact: 9:09 PM (approximate)   History   Nasal Congestion, Cough, and Fatigue   HPI  Randall Stanley is a 42 y.o. male presents to the emergency department with cough and nasal congestion for approximately 1 week.  Patient reports that his wife brought home a viral illness and he has not completely recovered.  He denies shortness of breath or chest tightness.  He states that over-the-counter medicines have not been helping him at home.  He has been afebrile.  No vomiting or diarrhea.      Physical Exam   Triage Vital Signs: ED Triage Vitals  Enc Vitals Group     BP 09/24/22 1907 (!) 133/100     Pulse Rate 09/24/22 1907 (!) 111     Resp 09/24/22 1907 20     Temp 09/24/22 1907 98.3 F (36.8 C)     Temp src --      SpO2 09/24/22 1907 94 %     Weight 09/24/22 1908 240 lb 4.8 oz (109 kg)     Height 09/24/22 1908 5\' 9"  (1.753 m)     Head Circumference --      Peak Flow --      Pain Score --      Pain Loc --      Pain Edu? --      Excl. in GC? --     Most recent vital signs: Vitals:   09/24/22 1907  BP: (!) 133/100  Pulse: (!) 111  Resp: 20  Temp: 98.3 F (36.8 C)  SpO2: 94%     Constitutional: Alert and oriented. Patient is lying supine. Eyes: Conjunctivae are normal. PERRL. EOMI. Head: Atraumatic. ENT:      Ears: Tympanic membranes are mildly injected with mild effusion bilaterally.       Nose: No congestion/rhinnorhea.      Mouth/Throat: Mucous membranes are moist. Posterior pharynx is mildly erythematous.  Hematological/Lymphatic/Immunilogical: No cervical lymphadenopathy.  Cardiovascular: Normal rate, regular rhythm. Normal S1 and S2.  Good peripheral circulation. Respiratory: Normal respiratory effort without tachypnea or retractions. Lungs CTAB. Good air entry to the bases with no decreased or absent breath sounds. Gastrointestinal: Bowel sounds 4 quadrants. Soft and nontender to palpation. No  guarding or rigidity. No palpable masses. No distention. No CVA tenderness. Musculoskeletal: Full range of motion to all extremities. No gross deformities appreciated. Neurologic:  Normal speech and language. No gross focal neurologic deficits are appreciated.  Skin:  Skin is warm, dry and intact. No rash noted. Psychiatric: Mood and affect are normal. Speech and behavior are normal. Patient exhibits appropriate insight and judgement.   ED Results / Procedures / Treatments   Labs (all labs ordered are listed, but only abnormal results are displayed) Labs Reviewed  SARS CORONAVIRUS 2 BY RT PCR        RADIOLOGY  I personally viewed and evaluated these images as part of my medical decision making, as well as reviewing the written report by the radiologist.  ED Provider Interpretation: No acute abnormality on chest x-ray.   PROCEDURES:  Critical Care performed: No  Procedures   MEDICATIONS ORDERED IN ED: Medications - No data to display   IMPRESSION / MDM / ASSESSMENT AND PLAN / ED COURSE  I reviewed the triage vital signs and the nursing notes.  Assessment and plan Bronchitis 42 year old male presents to the emergency department with cough for the past week.  Patient was mildly tachycardic and tachypneic at triage.  On exam, patient was alert and nontoxic-appearing without any increased work of breathing.  He had good breath sounds in the lung bases without adventitious lung sounds.  Will treat patient with prednisone and azithromycin and have him follow-up with primary care as needed.  Patient was also prescribed Tessalon Perles.      FINAL CLINICAL IMPRESSION(S) / ED DIAGNOSES   Final diagnoses:  Bronchitis     Rx / DC Orders   ED Discharge Orders          Ordered    predniSONE (STERAPRED UNI-PAK 21 TAB) 10 MG (21) TBPK tablet        09/24/22 2104    azithromycin (ZITHROMAX Z-PAK) 250 MG tablet        09/24/22 2104     benzonatate (TESSALON PERLES) 100 MG capsule  3 times daily PRN        09/24/22 2104             Note:  This document was prepared using Dragon voice recognition software and may include unintentional dictation errors.   Gasper Lloyd 09/24/22 2110    Sharman Cheek, MD 09/27/22 [phone]

## 2023-05-05 ENCOUNTER — Encounter: Payer: Self-pay | Admitting: Internal Medicine

## 2023-05-05 ENCOUNTER — Ambulatory Visit (INDEPENDENT_AMBULATORY_CARE_PROVIDER_SITE_OTHER): Payer: Managed Care, Other (non HMO) | Admitting: Internal Medicine

## 2023-05-05 ENCOUNTER — Telehealth: Payer: Self-pay | Admitting: *Deleted

## 2023-05-05 ENCOUNTER — Other Ambulatory Visit: Payer: Self-pay

## 2023-05-05 VITALS — BP 120/80 | HR 84 | Temp 98.3°F | Resp 18 | Ht 69.0 in | Wt 250.9 lb

## 2023-05-05 DIAGNOSIS — Z87898 Personal history of other specified conditions: Secondary | ICD-10-CM | POA: Diagnosis not present

## 2023-05-05 DIAGNOSIS — F419 Anxiety disorder, unspecified: Secondary | ICD-10-CM | POA: Diagnosis not present

## 2023-05-05 DIAGNOSIS — Z13220 Encounter for screening for lipoid disorders: Secondary | ICD-10-CM

## 2023-05-05 DIAGNOSIS — R590 Localized enlarged lymph nodes: Secondary | ICD-10-CM

## 2023-05-05 DIAGNOSIS — R519 Other chronic pain: Secondary | ICD-10-CM

## 2023-05-05 DIAGNOSIS — G8929 Other chronic pain: Secondary | ICD-10-CM

## 2023-05-05 MED ORDER — HYDROXYZINE HCL 10 MG PO TABS
10.0000 mg | ORAL_TABLET | Freq: Every evening | ORAL | 0 refills | Status: DC | PRN
Start: 2023-05-05 — End: 2023-06-01

## 2023-05-05 MED ORDER — FLUOXETINE HCL 10 MG PO CAPS
10.0000 mg | ORAL_CAPSULE | Freq: Every day | ORAL | 1 refills | Status: DC
Start: 2023-05-05 — End: 2023-06-20

## 2023-05-05 MED ORDER — NAPROXEN 500 MG PO TABS
500.0000 mg | ORAL_TABLET | Freq: Every day | ORAL | 0 refills | Status: DC | PRN
Start: 2023-05-05 — End: 2023-09-16

## 2023-05-05 NOTE — Assessment & Plan Note (Signed)
Recheck A1c 

## 2023-05-05 NOTE — Assessment & Plan Note (Signed)
Exacerbated, start Prozac 10 mg daily and Hydroxyzine PRN. Recheck in 6 weeks. Potential side effects discussed with patient.

## 2023-05-05 NOTE — Assessment & Plan Note (Signed)
Lymph node still present and causing headaches, will prescribe Naproxen for now and discuss next time if repeat imaging or ENT referral is indicated.

## 2023-05-05 NOTE — Progress Notes (Signed)
Acute Office Visit  Subjective:     Patient ID: Randall Stanley, male    DOB: Mar 29, 1981, 42 y.o.   MRN: [id number]  Chief Complaint  Patient presents with   Anxiety    HPI Patient is in today for anxiety. He is here with his wife today.   Anxiety: -Duration:worse -Anxious mood: yes  -Excessive worrying: yes -Irritability: yes  -Nausea: yes with vomiting after eating frequently  -Depressed mood: yes    05/05/2023    7:48 AM 03/19/2022    3:27 PM 02/08/2022    9:09 AM  Depression screen PHQ 2/9  Decreased Interest 2 0 0  Down, Depressed, Hopeless 3 0 0  PHQ - 2 Score 5 0 0  Altered sleeping 3 0 0  Tired, decreased energy 3 0 0  Change in appetite 3 0 0  Feeling bad or failure about yourself  3 0 0  Trouble concentrating 0 0 0  Moving slowly or fidgety/restless 3 0 0  Suicidal thoughts 0 0 0  PHQ-9 Score 20 0 0  Difficult doing work/chores Somewhat difficult Not difficult at all Not difficult at all   -Recent Stressors/Life Changes: yes   Relationship problems: no   Family stress: yes     Financial stress: yes    Job stress: yes, just started a new job   Recent death/loss: yes -Current Treatments: nothing -Past Treatments: nothing, mother does well on Prozac -Counseling: no  Posterior Cervical Lymphadenopathy: -Enlarged posterior cervical lymph node on the right first diagnosed several years ago -Had been seeing ENT and was planning a surgical procedure, however patient could not afford at the time and now is no longer seeing ENT -Does have consistent pain associated with it, headaches are getting worse but he does not take any medication for them -Had been on muscle relaxers and topical medication in the past -CT soft tissue of the neck with contrast from 02/08/2020 showing subcutaneous lymph node in right posterior lateral neck measuring 7.8 mm with a fatty hilum and appears to be benign.  No changes since 2018.  No pathologically enlarged lymph nodes in the  neck with bilateral tonsillar hypertrophy.   Review of Systems  Constitutional:  Negative for chills and fever.  Respiratory:  Negative for shortness of breath.   Cardiovascular:  Negative for chest pain.  Gastrointestinal:  Positive for nausea and vomiting. Negative for abdominal pain.  Neurological:  Positive for headaches.  Psychiatric/Behavioral:  Positive for depression. The patient is nervous/anxious.         Objective:    BP 120/80   Pulse 84   Temp 98.3 F (36.8 C) (Oral)   Resp 18   Ht 5\' 9"  (1.753 m)   Wt 250 lb 14.4 oz (113.8 kg)   SpO2 98%   BMI 37.05 kg/m    Physical Exam Constitutional:      Appearance: Normal appearance.  HENT:     Head: Normocephalic and atraumatic.  Eyes:     Conjunctiva/sclera: Conjunctivae normal.  Neck:     Comments: Large posterior cervical lymph node palpated on the right Cardiovascular:     Rate and Rhythm: Normal rate and regular rhythm.  Pulmonary:     Effort: Pulmonary effort is normal.     Breath sounds: Normal breath sounds.  Lymphadenopathy:     Cervical: Cervical adenopathy present.  Skin:    General: Skin is warm and dry.  Neurological:     General: No focal deficit present.  Mental Status: He is alert. Mental status is at baseline.  Psychiatric:        Mood and Affect: Mood normal.        Behavior: Behavior normal.     No results found for any visits on 05/05/23.      Assessment & Plan:   Problem List Items Addressed This Visit       Endocrine   History of prediabetes   Recheck A1c.       Relevant Orders   HgB A1c     Immune and Lymphatic   Posterior cervical lymphadenopathy   Lymph node still present and causing headaches, will prescribe Naproxen for now and discuss next time if repeat imaging or ENT referral is indicated.       Relevant Medications   naproxen (NAPROSYN) 500 MG tablet   Other Relevant Orders   CBC w/Diff/Platelet   COMPLETE METABOLIC PANEL WITH GFR     Other    Anxiety - Primary   Exacerbated, start Prozac 10 mg daily and Hydroxyzine PRN. Recheck in 6 weeks. Potential side effects discussed with patient.       Relevant Medications   FLUoxetine (PROZAC) 10 MG capsule   hydrOXYzine (ATARAX) 10 MG tablet   Other Visit Diagnoses       Lipid screening       Relevant Orders   Lipid Profile     Chronic nonintractable headache, unspecified headache type       Relevant Medications   naproxen (NAPROSYN) 500 MG tablet   FLUoxetine (PROZAC) 10 MG capsule       Meds ordered this encounter  Medications   naproxen (NAPROSYN) 500 MG tablet    Sig: Take 1 tablet (500 mg total) by mouth daily as needed for moderate pain (pain score 4-6).    Dispense:  30 tablet    Refill:  0   FLUoxetine (PROZAC) 10 MG capsule    Sig: Take 1 capsule (10 mg total) by mouth daily.    Dispense:  30 capsule    Refill:  1   hydrOXYzine (ATARAX) 10 MG tablet    Sig: Take 1 tablet (10 mg total) by mouth at bedtime as needed for anxiety.    Dispense:  30 tablet    Refill:  0    Return in about 6 weeks (around 06/16/2023) for follow up for medication recheck.  Margarita Mail, DO

## 2023-05-05 NOTE — Telephone Encounter (Signed)
  Chief Complaint: Medication Symptoms: NA Frequency: NA Pertinent Negatives: Patient denies NA Disposition: [] ED /[] Urgent Care (no appt availability in office) / [] Appointment(In office/virtual)/ []  [facility]/ [] Home Care/ [] Refused Recommended Disposition /[] [place] [facility]/ []  Follow-up with PCP Additional Notes:   Pt's wife calling, pt present. States at pharmacy, [facility], [place], med order for Prozac not received.  Per Interface, received at 7:43 today. Gave verbal order as requested.    FLUoxetine (PROZAC) 10 MG capsule Take 1 capsule (10 mg total) by mouth daily. Dispense: 30 capsule, Refills: 1 ordered   05/05/2023 --   Spoke to '[facility]' pharmacist.

## 2023-05-06 LAB — CBC WITH DIFFERENTIAL/PLATELET
Absolute Lymphocytes: 2173 {cells}/uL (ref 850–3900)
Absolute Monocytes: 940 {cells}/uL (ref 200–950)
Basophils Absolute: 63 {cells}/uL (ref 0–200)
Basophils Relative: 0.8 %
Eosinophils Absolute: 371 {cells}/uL (ref 15–500)
Eosinophils Relative: 4.7 %
HCT: 50.5 % — ABNORMAL HIGH (ref 38.5–50.0)
Hemoglobin: 16.9 g/dL (ref 13.2–17.1)
MCH: 29.6 pg (ref 27.0–33.0)
MCHC: 33.5 g/dL (ref 32.0–36.0)
MCV: 88.4 fL (ref 80.0–100.0)
MPV: 9.2 fL (ref 7.5–12.5)
Monocytes Relative: 11.9 %
Neutro Abs: 4353 {cells}/uL (ref 1500–7800)
Neutrophils Relative %: 55.1 %
Platelets: 290 10*3/uL (ref 140–400)
RBC: 5.71 10*6/uL (ref 4.20–5.80)
RDW: 12.3 % (ref 11.0–15.0)
Total Lymphocyte: 27.5 %
WBC: 7.9 10*3/uL (ref 3.8–10.8)

## 2023-05-06 LAB — COMPLETE METABOLIC PANEL WITH GFR
AG Ratio: 1.5 (calc) (ref 1.0–2.5)
ALT: 42 U/L (ref 9–46)
AST: 28 U/L (ref 10–40)
Albumin: 4.5 g/dL (ref 3.6–5.1)
Alkaline phosphatase (APISO): 50 U/L (ref 36–130)
BUN: 9 mg/dL (ref 7–25)
CO2: 30 mmol/L (ref 20–32)
Calcium: 9.5 mg/dL (ref 8.6–10.3)
Chloride: 103 mmol/L (ref 98–110)
Creat: 0.86 mg/dL (ref 0.60–1.29)
Globulin: 3.1 g/dL (ref 1.9–3.7)
Glucose, Bld: 82 mg/dL (ref 65–99)
Potassium: 4.9 mmol/L (ref 3.5–5.3)
Sodium: 140 mmol/L (ref 135–146)
Total Bilirubin: 0.4 mg/dL (ref 0.2–1.2)
Total Protein: 7.6 g/dL (ref 6.1–8.1)
eGFR: 110 mL/min/{1.73_m2} (ref >=60)

## 2023-05-06 LAB — HEMOGLOBIN A1C
Hgb A1c MFr Bld: 6.3 %{Hb} — ABNORMAL HIGH (ref <5.7)
Mean Plasma Glucose: 134 mg/dL
eAG (mmol/L): 7.4 mmol/L

## 2023-05-06 LAB — LIPID PANEL
Cholesterol: 138 mg/dL (ref <200)
HDL: 42 mg/dL (ref >=40)
LDL Cholesterol (Calc): 79 mg/dL
Non-HDL Cholesterol (Calc): 96 mg/dL (ref <130)
Total CHOL/HDL Ratio: 3.3 (calc) (ref <5.0)
Triglycerides: 83 mg/dL (ref <150)

## 2023-06-01 ENCOUNTER — Other Ambulatory Visit: Payer: Self-pay | Admitting: Internal Medicine

## 2023-06-01 DIAGNOSIS — F419 Anxiety disorder, unspecified: Secondary | ICD-10-CM

## 2023-06-01 NOTE — Telephone Encounter (Signed)
Requested Prescriptions  Pending Prescriptions Disp Refills   hydrOXYzine (ATARAX) 10 MG tablet [Pharmacy Med Name: HYDROXYZINE HCL 10 MG TABLET] 30 tablet 0    Sig: TAKE 1 TABLET (10 MG TOTAL) BY MOUTH AT BEDTIME AS NEEDED FOR ANXIETY     Ear, Nose, and Throat:  Antihistamines 2 Passed - 06/01/2023 10:44 AM      Passed - Cr in normal range and within 360 days    Creat  Date Value Ref Range Status  05/05/2023 0.86 0.60 - 1.29 mg/dL Final         Passed - Valid encounter within last 12 months    Recent Outpatient Visits           3 weeks ago Anxiety   [facility] Margarita Mail, DO   1 year ago Nausea and vomiting, unspecified vomiting type   [facility] Danelle Berry, PA-C   1 year ago Testicular lesion   [facility] Margarita Mail, DO       Future Appointments             In 2 weeks Margarita Mail, DO [facility], [facility]

## 2023-06-20 ENCOUNTER — Encounter: Payer: Self-pay | Admitting: Internal Medicine

## 2023-06-20 ENCOUNTER — Ambulatory Visit: Payer: Managed Care, Other (non HMO) | Admitting: Internal Medicine

## 2023-06-20 ENCOUNTER — Other Ambulatory Visit: Payer: Self-pay

## 2023-06-20 VITALS — BP 130/82 | HR 81 | Temp 98.0°F | Resp 18 | Ht 69.0 in | Wt 244.6 lb

## 2023-06-20 DIAGNOSIS — R590 Localized enlarged lymph nodes: Secondary | ICD-10-CM

## 2023-06-20 DIAGNOSIS — R7303 Prediabetes: Secondary | ICD-10-CM

## 2023-06-20 DIAGNOSIS — F419 Anxiety disorder, unspecified: Secondary | ICD-10-CM

## 2023-06-20 MED ORDER — HYDROXYZINE HCL 10 MG PO TABS
10.0000 mg | ORAL_TABLET | Freq: Every evening | ORAL | 0 refills | Status: DC | PRN
Start: 2023-06-20 — End: 2023-07-21

## 2023-06-20 MED ORDER — FLUOXETINE HCL 10 MG PO CAPS: 10.0000 mg | ORAL_CAPSULE | Freq: Every day | ORAL | 0 refills | Status: DC

## 2023-06-20 NOTE — Assessment & Plan Note (Signed)
 A1c last month 6.3%, patient working on decreasing sugar and carbs in the diet, lost about 6 pounds since last visit.

## 2023-06-20 NOTE — Progress Notes (Signed)
 Established Patient Office Visit  Subjective   Patient ID: Randall Stanley, male    DOB: 1981/02/17  Age: 43 y.o. MRN: [id number]  Chief Complaint  Patient presents with   Follow-up    HPI  Patient is in today for recheck on anxiety.   Anxiety: -Overall improving, not as irritable and doing better at work. Does sometimes have hyperactive symptoms  -Anxious mood: yes  -Excessive worrying: yes -Irritability: yes  -Nausea: yes with vomiting after eating frequently  -Depressed mood: yes     06/20/2023    2:46 PM 05/05/2023    7:48 AM 03/19/2022    3:27 PM 02/08/2022    9:09 AM  Depression screen PHQ 2/9  Decreased Interest 1 2 0 0  Down, Depressed, Hopeless 1 3 0 0  PHQ - 2 Score 2 5 0 0  Altered sleeping 2 3 0 0  Tired, decreased energy 1 3 0 0  Change in appetite 1 3 0 0  Feeling bad or failure about yourself  1 3 0 0  Trouble concentrating 1 0 0 0  Moving slowly or fidgety/restless 2 3 0 0  Suicidal thoughts 0 0 0 0  PHQ-9 Score 10 20 0 0  Difficult doing work/chores Not difficult at all Somewhat difficult Not difficult at all Not difficult at all   -Current Treatments: started on Prozac  10 mg and Hydroxyzine  PRN -Past Treatments: nothing, mother does well on Prozac  -Counseling: no  Posterior Cervical Lymphadenopathy: -Enlarged posterior cervical lymph node on the right first diagnosed several years ago -Had been seeing ENT and was planning a surgical procedure, however patient could not afford at the time and now is no longer seeing ENT -Does have consistent pain associated with it, headaches are getting worse but he does not take any medication for them -Had been on muscle relaxers and topical medication in the past -CT soft tissue of the neck with contrast from 02/08/2020 showing subcutaneous lymph node in right posterior lateral neck measuring 7.8 mm with a fatty hilum and appears to be benign.  No changes since 2018.  No pathologically enlarged lymph nodes in the  neck with bilateral tonsillar hypertrophy. -Still having headaches from the lymph node  Pre-Diabetes: -A1c 12/24 6.3% -Not currently on medications but lost some weight since last time and working on diet    Review of Systems  Constitutional:  Negative for chills and fever.  Neurological:  Positive for headaches.      Objective:     BP 130/82 (Cuff Size: Large)   Pulse 81   Temp 98 F (36.7 C) (Oral)   Resp 18   Ht 5\' 9"  (1.753 m)   Wt 244 lb 9.6 oz (110.9 kg)   BMI 36.12 kg/m  BP Readings from Last 3 Encounters:  06/20/23 130/82  05/05/23 120/80  09/24/22 131/89   Wt Readings from Last 3 Encounters:  06/20/23 244 lb 9.6 oz (110.9 kg)  05/05/23 250 lb 14.4 oz (113.8 kg)  09/24/22 240 lb 4.8 oz (109 kg)    Physical Exam Constitutional:      Appearance: Normal appearance.  HENT:     Head: Normocephalic and atraumatic.  Eyes:     Conjunctiva/sclera: Conjunctivae normal.  Neck:     Comments: Enlarged posterolateral lymph node on the right Cardiovascular:     Rate and Rhythm: Normal rate and regular rhythm.  Pulmonary:     Effort: Pulmonary effort is normal.     Breath sounds: Normal breath  sounds.  Skin:    General: Skin is warm and dry.  Neurological:     General: No focal deficit present.     Mental Status: He is alert. Mental status is at baseline.  Psychiatric:        Mood and Affect: Mood normal.        Behavior: Behavior normal.      No results found for any visits on 06/20/23.  Last CBC Lab Results  Component Value Date   WBC 7.9 05/05/2023   HGB 16.9 05/05/2023   HCT 50.5 (H) 05/05/2023   MCV 88.4 05/05/2023   MCH 29.6 05/05/2023   RDW 12.3 05/05/2023   PLT 290 05/05/2023   Last metabolic panel Lab Results  Component Value Date   GLUCOSE 82 05/05/2023   NA 140 05/05/2023   K 4.9 05/05/2023   CL 103 05/05/2023   CO2 30 05/05/2023   BUN 9 05/05/2023   CREATININE 0.86 05/05/2023   EGFR 110 05/05/2023   CALCIUM 9.5 05/05/2023    PROT 7.6 05/05/2023   ALBUMIN 4.4 11/28/2015   BILITOT 0.4 05/05/2023   ALKPHOS 48 11/28/2015   AST 28 05/05/2023   ALT 42 05/05/2023   ANIONGAP 7 11/24/2016   Last lipids Lab Results  Component Value Date   CHOL 138 05/05/2023   HDL 42 05/05/2023   LDLCALC 79 05/05/2023   TRIG 83 05/05/2023   CHOLHDL 3.3 05/05/2023   Last hemoglobin A1c Lab Results  Component Value Date   HGBA1C 6.3 (H) 05/05/2023   Last thyroid  functions No results found for: "TSH", "T3TOTAL", "T4TOTAL", "THYROIDAB" Last vitamin D No results found for: "25OHVITD2", "25OHVITD3", "VD25OH" Last vitamin B12 and Folate No results found for: "VITAMINB12", "FOLATE"    The 10-year ASCVD risk score (Arnett DK, et al., 2019) is: 1.1%    Assessment & Plan:  Anxiety Assessment & Plan: Improved since LOV, patient wanting to keep the Prozac  the same, will refill. Discussed starting Wellbutrin to help with hyperactive symptoms, patient would rather stay on the Prozac , will continue to monitor.   Orders: -     FLUoxetine  HCl; Take 1 capsule (10 mg total) by mouth daily.  Dispense: 90 capsule; Refill: 0 -     hydrOXYzine  HCl; Take 1 tablet (10 mg total) by mouth at bedtime as needed for anxiety.  Dispense: 30 tablet; Refill: 0  Posterior cervical lymphadenopathy Assessment & Plan: Lymph node still enlarged, having headaches and pain. Will re-order US  to be sure it hasn't changed in size.  Orders: -     US  SOFT TISSUE HEAD & NECK (NON-THYROID ); Future  Prediabetes Assessment & Plan: A1c last month 6.3%, patient working on decreasing sugar and carbs in the diet, lost about 6 pounds since last visit.       Return in about 2 months (around 08/18/2023).    Rockney Cid, DO

## 2023-06-20 NOTE — Assessment & Plan Note (Signed)
 Improved since LOV, patient wanting to keep the Prozac  the same, will refill. Discussed starting Wellbutrin to help with hyperactive symptoms, patient would rather stay on the Prozac , will continue to monitor.

## 2023-06-20 NOTE — Assessment & Plan Note (Signed)
 Lymph node still enlarged, having headaches and pain. Will re-order US  to be sure it hasn't changed in size.

## 2023-07-21 ENCOUNTER — Other Ambulatory Visit: Payer: Self-pay | Admitting: Internal Medicine

## 2023-07-21 DIAGNOSIS — F419 Anxiety disorder, unspecified: Secondary | ICD-10-CM

## 2023-07-21 NOTE — Telephone Encounter (Signed)
 Requested Prescriptions  Pending Prescriptions Disp Refills   hydrOXYzine (ATARAX) 10 MG tablet [Pharmacy Med Name: HYDROXYZINE HCL 10 MG TABLET] 30 tablet 0    Sig: TAKE 1 TABLET (10 MG TOTAL) BY MOUTH AT BEDTIME AS NEEDED FOR ANXIETY     Ear, Nose, and Throat:  Antihistamines 2 Passed - 07/21/2023 12:20 PM      Passed - Cr in normal range and within 360 days    Creat  Date Value Ref Range Status  05/05/2023 0.86 0.60 - 1.29 mg/dL Final         Passed - Valid encounter within last 12 months    Recent Outpatient Visits           2 months ago Anxiety   [facility] Margarita Mail, DO   1 year ago Nausea and vomiting, unspecified vomiting type   [facility] Danelle Berry, PA-C   1 year ago Testicular lesion   [facility] Margarita Mail, DO       Future Appointments             In 4 weeks Margarita Mail, DO [facility]

## 2023-08-18 ENCOUNTER — Encounter: Payer: Self-pay | Admitting: Internal Medicine

## 2023-08-18 ENCOUNTER — Ambulatory Visit (INDEPENDENT_AMBULATORY_CARE_PROVIDER_SITE_OTHER): Payer: Managed Care, Other (non HMO) | Admitting: Internal Medicine

## 2023-08-18 VITALS — BP 122/74 | HR 101 | Resp 16 | Ht 69.0 in | Wt 247.3 lb

## 2023-08-18 DIAGNOSIS — F419 Anxiety disorder, unspecified: Secondary | ICD-10-CM

## 2023-08-18 MED ORDER — FLUOXETINE HCL 20 MG PO CAPS
20.0000 mg | ORAL_CAPSULE | Freq: Every day | ORAL | 1 refills | Status: DC
Start: 2023-08-18 — End: 2024-02-28

## 2023-08-18 NOTE — Progress Notes (Signed)
 Established Patient Office Visit  Subjective   Patient ID: Randall Stanley, male    DOB: Jul 23, 1980  Age: 43 y.o. MRN: [id number]  Chief Complaint  Patient presents with   Medical Management of Chronic Issues    HPI  Patient is in today for recheck on anxiety.   Discussed the use of AI scribe software for clinical note transcription with the patient, who gave verbal consent to proceed.  History of Present Illness The patient, with a history of anxiety, reports mixed results with his current treatment regimen. He is currently on a low dose of Prozac (10mg ), which he feels is mostly effective, but he still experiences frequent irritation and moments of unexplained anxiety. The patient's partner corroborates this, noting that the patient often retreats to the bathroom for hours at a time to escape from others and can become irritated without apparent cause. The patient also reports a constant stream of thoughts, which he has experienced since childhood. Despite these ongoing symptoms, the patient's depression screening scores have been improving, dropping from 20 in December to 6 at the current visit. The patient also has a prescription for hydroxyzine, which he takes as needed. The patient has not yet scheduled an ultrasound that was previously ordered.     08/18/2023    2:26 PM 06/20/2023    2:46 PM 05/05/2023    7:48 AM 03/19/2022    3:27 PM 02/08/2022    9:09 AM  Depression screen PHQ 2/9  Decreased Interest 1 1 2  0 0  Down, Depressed, Hopeless 1 1 3  0 0  PHQ - 2 Score 2 2 5  0 0  Altered sleeping 1 2 3  0 0  Tired, decreased energy 1 1 3  0 0  Change in appetite 1 1 3  0 0  Feeling bad or failure about yourself  0 1 3 0 0  Trouble concentrating 1 1 0 0 0  Moving slowly or fidgety/restless 0 2 3 0 0  Suicidal thoughts 0 0 0 0 0  PHQ-9 Score 6 10 20  0 0  Difficult doing work/chores Somewhat difficult Not difficult at all Somewhat difficult Not difficult at all Not difficult at all    -Current Treatments:Prozac 10 mg and Hydroxyzine PRN -Past Treatments: nothing, mother does well on Prozac -Counseling: no  Posterior Cervical Lymphadenopathy: -Enlarged posterior cervical lymph node on the right first diagnosed several years ago -Had been seeing ENT and was planning a surgical procedure, however patient could not afford at the time and now is no longer seeing ENT -Does have consistent pain associated with it, headaches are getting worse but he does not take any medication for them -Had been on muscle relaxers and topical medication in the past -CT soft tissue of the neck with contrast from 02/08/2020 showing subcutaneous lymph node in right posterior lateral neck measuring 7.8 mm with a fatty hilum and appears to be benign.  No changes since 2018.  No pathologically enlarged lymph nodes in the neck with bilateral tonsillar hypertrophy. -Still having headaches from the lymph node -US ordered at LOV but not yet obtained  Pre-Diabetes: -A1c 12/24 6.3% -Not currently on medications and has been eating worse lately    Review of Systems  All other systems reviewed and are negative.     Objective:     BP 122/74   Pulse (!) 101   Resp 16   Ht 5\' 9"  (1.753 m)   Wt 247 lb 4.8 oz (112.2 kg)   SpO2 97%  BMI 36.52 kg/m  BP Readings from Last 3 Encounters:  08/18/23 122/74  06/20/23 130/82  05/05/23 120/80   Wt Readings from Last 3 Encounters:  08/18/23 247 lb 4.8 oz (112.2 kg)  06/20/23 244 lb 9.6 oz (110.9 kg)  05/05/23 250 lb 14.4 oz (113.8 kg)    Physical Exam Constitutional:      Appearance: Normal appearance.  HENT:     Head: Normocephalic and atraumatic.  Eyes:     Conjunctiva/sclera: Conjunctivae normal.  Neck:     Comments: Enlarged posterolateral lymph node on the right Cardiovascular:     Rate and Rhythm: Normal rate and regular rhythm.  Pulmonary:     Effort: Pulmonary effort is normal.     Breath sounds: Normal breath sounds.  Skin:     General: Skin is warm and dry.  Neurological:     General: No focal deficit present.     Mental Status: He is alert. Mental status is at baseline.  Psychiatric:        Mood and Affect: Mood normal.        Behavior: Behavior normal.      No results found for any visits on 08/18/23.  Last CBC Lab Results  Component Value Date   WBC 7.9 05/05/2023   HGB 16.9 05/05/2023   HCT 50.5 (H) 05/05/2023   MCV 88.4 05/05/2023   MCH 29.6 05/05/2023   RDW 12.3 05/05/2023   PLT 290 05/05/2023   Last metabolic panel Lab Results  Component Value Date   GLUCOSE 82 05/05/2023   NA 140 05/05/2023   K 4.9 05/05/2023   CL 103 05/05/2023   CO2 30 05/05/2023   BUN 9 05/05/2023   CREATININE 0.86 05/05/2023   EGFR 110 05/05/2023   CALCIUM 9.5 05/05/2023   PROT 7.6 05/05/2023   ALBUMIN 4.4 11/28/2015   BILITOT 0.4 05/05/2023   ALKPHOS 48 11/28/2015   AST 28 05/05/2023   ALT 42 05/05/2023   ANIONGAP 7 11/24/2016   Last lipids Lab Results  Component Value Date   CHOL 138 05/05/2023   HDL 42 05/05/2023   LDLCALC 79 05/05/2023   TRIG 83 05/05/2023   CHOLHDL 3.3 05/05/2023   Last hemoglobin A1c Lab Results  Component Value Date   HGBA1C 6.3 (H) 05/05/2023   Last thyroid functions No results found for: "TSH", "T3TOTAL", "T4TOTAL", "THYROIDAB" Last vitamin D No results found for: "25OHVITD2", "25OHVITD3", "VD25OH" Last vitamin B12 and Folate No results found for: "VITAMINB12", "FOLATE"    The 10-year ASCVD risk score (Arnett DK, et al., 2019) is: 0.9%    Assessment & Plan:   Assessment & Plan Generalized Anxiety Disorder Improvement with fluoxetine 10 mg, but persistent anxiety and irritation. Depression scores improved from 20 to 6. - Increase fluoxetine to 20 mg daily. - Continue hydroxyzine as needed. - Reassess symptoms at follow-up.  Prediabetes A1c 6.3 in December. Dietary indiscretions noted, impacting blood sugar control. - Advise dietary modifications: avoid  caloric beverages, reduce sugar intake. - Recheck A1c in three months.   Return in about 3 months (around 11/17/2023).    Margarita Mail, DO

## 2023-08-25 ENCOUNTER — Ambulatory Visit
Admission: RE | Admit: 2023-08-25 | Discharge: 2023-08-25 | Disposition: A | Discharge status: Home / Self Care | Source: Ambulatory Visit | Attending: Internal Medicine | Admitting: Internal Medicine

## 2023-08-25 DIAGNOSIS — R590 Localized enlarged lymph nodes: Secondary | ICD-10-CM | POA: Diagnosis present

## 2023-08-29 ENCOUNTER — Encounter: Payer: Self-pay | Admitting: Internal Medicine

## 2023-09-14 ENCOUNTER — Other Ambulatory Visit: Payer: Self-pay | Admitting: Internal Medicine

## 2023-09-14 DIAGNOSIS — R590 Localized enlarged lymph nodes: Secondary | ICD-10-CM

## 2023-09-14 DIAGNOSIS — R519 Headache, unspecified: Secondary | ICD-10-CM

## 2023-09-16 NOTE — Telephone Encounter (Signed)
 Requested Prescriptions  Pending Prescriptions Disp Refills   naproxen  (NAPROSYN ) 500 MG tablet [Pharmacy Med Name: NAPROXEN  500 MG TABLET] 30 tablet 0    Sig: TAKE 1 TABLET (500 MG TOTAL) BY MOUTH DAILY AS NEEDED FOR MODERATE PAIN (PAIN SCORE 4-6).     Analgesics:  NSAIDS Failed - 09/16/2023  1:29 PM      Failed - Manual Review: Labs are only required if the patient has taken medication for more than 8 weeks.      Failed - HCT in normal range and within 360 days    HCT  Date Value Ref Range Status  05/05/2023 50.5 (H) 38.5 - 50.0 % Final         Passed - Cr in normal range and within 360 days    Creat  Date Value Ref Range Status  05/05/2023 0.86 0.60 - 1.29 mg/dL Final         Passed - HGB in normal range and within 360 days    Hemoglobin  Date Value Ref Range Status  05/05/2023 16.9 13.2 - 17.1 g/dL Final         Passed - PLT in normal range and within 360 days    Platelets  Date Value Ref Range Status  05/05/2023 290 140 - 400 Thousand/uL Final         Passed - eGFR is 30 or above and within 360 days    GFR calc Af Amer  Date Value Ref Range Status  11/24/2016 >60 >60 mL/min Final    Comment:    (NOTE) The eGFR has been calculated using the CKD EPI equation. This calculation has not been validated in all clinical situations. eGFR's persistently <60 mL/min signify possible Chronic Kidney Disease.    GFR calc non Af Amer  Date Value Ref Range Status  11/24/2016 >60 >60 mL/min Final   GFR  Date Value Ref Range Status  11/28/2015 84.88 >60.00 mL/min Final   eGFR  Date Value Ref Range Status  05/05/2023 110 > OR = 60 mL/min/1.48m2 Final         Passed - Patient is not pregnant      Passed - Valid encounter within last 12 months    Recent Outpatient Visits           4 weeks ago Anxiety   [facility] Rockney Cid, DO   2 months ago Anxiety   [facility] Rockney Cid, [place]

## 2023-09-18 ENCOUNTER — Other Ambulatory Visit: Payer: Self-pay | Admitting: Internal Medicine

## 2023-09-18 DIAGNOSIS — F419 Anxiety disorder, unspecified: Secondary | ICD-10-CM

## 2023-09-20 NOTE — Telephone Encounter (Signed)
 Requested Prescriptions  Refused Prescriptions Disp Refills   FLUoxetine  (PROZAC ) 10 MG capsule [Pharmacy Med Name: FLUOXETINE  HCL 10 MG CAPSULE] 90 capsule 0    Sig: TAKE 1 CAPSULE BY MOUTH EVERY DAY     Psychiatry:  Antidepressants - SSRI Passed - 09/20/2023 12:13 PM      Passed - Valid encounter within last 6 months    Recent Outpatient Visits           1 month ago Anxiety   [facility] Rockney Cid, DO   3 months ago Anxiety   [facility] Rockney Cid, [place]

## 2023-09-28 ENCOUNTER — Ambulatory Visit: Admitting: Internal Medicine

## 2023-09-29 ENCOUNTER — Encounter: Payer: Self-pay | Admitting: Internal Medicine

## 2023-09-29 ENCOUNTER — Ambulatory Visit (INDEPENDENT_AMBULATORY_CARE_PROVIDER_SITE_OTHER): Admitting: Internal Medicine

## 2023-09-29 VITALS — BP 120/80 | HR 85 | Temp 98.4°F | Resp 16 | Ht 69.0 in | Wt 247.2 lb

## 2023-09-29 DIAGNOSIS — F419 Anxiety disorder, unspecified: Secondary | ICD-10-CM | POA: Diagnosis not present

## 2023-09-29 NOTE — Progress Notes (Signed)
 Established Patient Office Visit  Subjective   Patient ID: Randall Stanley, male    DOB: 10/07/1980  Age: 43 y.o. MRN: [id number]  Chief Complaint  Patient presents with   Anxiety    I am having extreme anxiety attacks, yesterday I spent the whole day being anxious and depressed and I don't know why. I am having anxiety attacks when around lots of people like at the grocery store and at work. It's makes me feel like my heart is racing and I'm going to be sick. Mainly when in a large setting of people at work and at stores    Anxiety Symptoms include nervous/anxious behavior.    Patient is in today for worsening anxiety.   Discussed the use of AI scribe software for clinical note transcription with the patient, who gave verbal consent to proceed.  History of Present Illness Randall Stanley is a 43 year old male who presents with worsening social anxiety and associated gastrointestinal symptoms.  He experiences significant anxiety symptoms, including sweating, feeling overheated, and nausea, particularly in crowded environments like his workplace on Fridays. These symptoms have been worsening and impact his work performance, as he spends considerable time in the bathroom to avoid crowds, affecting his productivity. He works a three-day week from Friday to Sunday, with Fridays being the most challenging. Anxiety symptoms begin to escalate on Wednesday afternoons in anticipation of Friday.  He takes Prozac  20 mg, which is effective from Saturday to Wednesday but less so as the week progresses towards Friday. He experiences gastrointestinal symptoms, including frequent bathroom visits and gas, particularly severe on Thursdays. He manages stress through activities like gardening and spending time in water.  Socially, he has signed up for online classes to avoid classroom settings, indicating a preference for environments with fewer people.     08/18/2023    2:26 PM 06/20/2023    2:46 PM  05/05/2023    7:48 AM 03/19/2022    3:27 PM 02/08/2022    9:09 AM  Depression screen PHQ 2/9  Decreased Interest 1 1 2  0 0  Down, Depressed, Hopeless 1 1 3  0 0  PHQ - 2 Score 2 2 5  0 0  Altered sleeping 1 2 3  0 0  Tired, decreased energy 1 1 3  0 0  Change in appetite 1 1 3  0 0  Feeling bad or failure about yourself  0 1 3 0 0  Trouble concentrating 1 1 0 0 0  Moving slowly or fidgety/restless 0 2 3 0 0  Suicidal thoughts 0 0 0 0 0  PHQ-9 Score 6 10 20  0 0  Difficult doing work/chores Somewhat difficult Not difficult at all Somewhat difficult Not difficult at all Not difficult at all   -Current Treatments:Prozac  20 mg and Hydroxyzine  PRN -Past Treatments: nothing, mother does well on Prozac  -Counseling: no   Review of Systems  Psychiatric/Behavioral:  The patient is nervous/anxious.   All other systems reviewed and are negative.     Objective:     BP 120/80   Pulse 85   Temp 98.4 F (36.9 C) (Oral)   Resp 16   Ht 5\' 9"  (1.753 m)   Wt 247 lb 3.2 oz (112.1 kg)   SpO2 98%   BMI 36.51 kg/m  BP Readings from Last 3 Encounters:  09/29/23 120/80  08/18/23 122/74  06/20/23 130/82   Wt Readings from Last 3 Encounters:  09/29/23 247 lb 3.2 oz (112.1 kg)  08/18/23 247 lb 4.8  oz (112.2 kg)  06/20/23 244 lb 9.6 oz (110.9 kg)    Physical Exam Constitutional:      Appearance: Normal appearance.  HENT:     Head: Normocephalic and atraumatic.  Eyes:     Conjunctiva/sclera: Conjunctivae normal.  Cardiovascular:     Rate and Rhythm: Normal rate and regular rhythm.  Pulmonary:     Effort: Pulmonary effort is normal.     Breath sounds: Normal breath sounds.  Skin:    General: Skin is warm and dry.  Neurological:     General: No focal deficit present.     Mental Status: He is alert. Mental status is at baseline.  Psychiatric:        Mood and Affect: Mood normal.        Behavior: Behavior normal.      No results found for any visits on 09/29/23.  Last CBC Lab  Results  Component Value Date   WBC 7.9 05/05/2023   HGB 16.9 05/05/2023   HCT 50.5 (H) 05/05/2023   MCV 88.4 05/05/2023   MCH 29.6 05/05/2023   RDW 12.3 05/05/2023   PLT 290 05/05/2023   Last metabolic panel Lab Results  Component Value Date   GLUCOSE 82 05/05/2023   NA 140 05/05/2023   K 4.9 05/05/2023   CL 103 05/05/2023   CO2 30 05/05/2023   BUN 9 05/05/2023   CREATININE 0.86 05/05/2023   EGFR 110 05/05/2023   CALCIUM 9.5 05/05/2023   PROT 7.6 05/05/2023   ALBUMIN 4.4 11/28/2015   BILITOT 0.4 05/05/2023   ALKPHOS 48 11/28/2015   AST 28 05/05/2023   ALT 42 05/05/2023   ANIONGAP 7 11/24/2016   Last lipids Lab Results  Component Value Date   CHOL 138 05/05/2023   HDL 42 05/05/2023   LDLCALC 79 05/05/2023   TRIG 83 05/05/2023   CHOLHDL 3.3 05/05/2023   Last hemoglobin A1c Lab Results  Component Value Date   HGBA1C 6.3 (H) 05/05/2023   Last thyroid  functions No results found for: "TSH", "T3TOTAL", "T4TOTAL", "THYROIDAB" Last vitamin D No results found for: "25OHVITD2", "25OHVITD3", "VD25OH" Last vitamin B12 and Folate No results found for: "VITAMINB12", "FOLATE"    The 10-year ASCVD risk score (Arnett DK, et al., 2019) is: 0.9%    Assessment & Plan:   Assessment & Plan Social Anxiety Disorder Social anxiety exacerbated by work environment, particularly on Fridays. Prozac  20 mg effective during the week but insufficient for severe anxiety on Fridays. Counseling recommended for behavioral aspects and triggers. Discussed potential FMLA for intermittent leave. He is considering telehealth counseling for convenience and better provider match. - Maintain Prozac  20 mg. - Refer to counseling, preferably telehealth through insurance. - Discuss with HR about FMLA for intermittent leave. - Encourage exploration of telehealth counseling options through insurance.  Irritable Bowel Syndrome IBS likely exacerbated by anxiety, particularly on Thursdays and Fridays.  Discussed stress and gut bacteria imbalance in symptom exacerbation. Recommended dietary modifications, probiotics, and fiber for symptom management. - Recommend probiotics, specifically Align. - Advise on fiber supplements such as Metamucil or Benefiber. - Counsel on dietary fiber intake, starting low and increasing slowly.  Return if symptoms worsen or fail to improve.    Rockney Cid, DO

## 2023-09-29 NOTE — Patient Instructions (Signed)
 It was great seeing you today!  Plan discussed at today's visit: -Recommend going through info insurance info to find a telehealth counselor -Continue same medications -Recommend starting Align probiotic and dietary fiber  -Talk to HR about FMLA forms  Follow up in: as needed  Take care and let us  know if you have any questions or concerns prior to your next visit.  Dr. Chalmers [place] Content in Foods Fiber is found in plant foods, such as fruits, vegetables, whole grains, nuts, seeds, and beans. If you have certain conditions, you may need to eat a high-fiber diet or a low-fiber diet. Your health care provider will tell you how much fiber you need. If you have problems or questions, contact your provider or dietitian. What foods are high in fiber?  Foods high in fiber have 4g of fiber or more per serving. Fruits Blackberries or raspberries (fresh) --  cup (75 g) has 4 g of fiber. Pear (fresh) -- 1 medium (180 g) has 5.5 g of fiber. Prunes (dried) -- 6 to 8 pieces (57-76 g) has 5 g of fiber. Apple with skin -- 1 medium (182 g) has 4.8 g of fiber. Guava -- 1 cup (128 g) has 8.9 g of fiber. Vegetables Peas (frozen) --  cup (80 g) has 4.4 g of fiber. Potato with skin (baked) -- 1 medium (173 g) has 4 g of fiber. Pumpkin (canned) --  cup (122 g) has 4 g of fiber. Sweet potato --  cup mashed (124 g) has 4 g of fiber. Winter squash -- 1 cup cooked (205 g) has 5.7 g of fiber. Grains Bran cereal --  cup (31 g) has 8.6 g of fiber. Bulgur (cooked) --  cup (70 g) has 4 g of fiber. Quinoa (cooked) -- 1 cup (185 g) has 5.2 g of fiber. Popcorn -- 3 cups (375 g) popped has 5.8 g of fiber. Spaghetti, whole wheat -- 1 cup (140 g) has 6 g of fiber. Oatmeal (cooked) -- 1 cup (234 g) has 4g of fiber. Meats and other proteins Pinto beans (cooked) --  cup (90 g) has 7.7 g of fiber. Lentils (cooked) --  cup (90 g) has 7.8 g of fiber. Kidney beans (canned) --  cup (92.5 g) has 5.7 g of  fiber. Soybeans (canned, frozen, or fresh) --  cup (92.5 g) has 5.2 g of fiber. Baked beans, plain or vegetarian (canned) --  cup (130 g) has 5.2 g of fiber. Garbanzo beans or chickpeas (canned) --  cup (90 g) has 6.6 g of fiber. Black beans (cooked) --  cup (86 g) has 7.5 g of fiber. White beans or navy beans (cooked) --  cup (91 g) has 9.3 g of fiber. The items listed above may not be a complete list of foods with high fiber. Actual amounts of fiber may be different depending on processing. Contact a dietitian for more information. What foods are moderate in fiber?  Moderate fiber foods have 1-3 g of fiber per serving. Fruits Banana -- 1 medium (126 g) has 3.2 g of fiber. Melon -- 1 cup (155 g) has 1.4 g of fiber. Orange -- 1 small (154 g) has 3.7 g of fiber. Raisins --  cup (40 g) has 1.8 g of fiber. Applesauce, sweetened --  cup (125 g) has 1.5 g of fiber. Blueberries (fresh) --  cup (75 g) has 1.8 g of fiber. Strawberries (fresh, sliced) -- 1 cup (150 g) has 3 g of fiber.  Cherries -- 1 cup (140 g) has 2.9 g of fiber. Vegetables Broccoli (cooked) --  cup (77.5 g) has 2.1 g of fiber. Brussels sprouts (cooked) --  cup (78 g) has 3 g of fiber. Carrots (cooked) --  cup (77.5 g) has 2.2 g of fiber. Corn (canned or frozen) --  cup (82.5 g) has 2.1 g of fiber. Potatoes, mashed --  cup (105 g) has 1.6 g of fiber. Tomato -- 1 medium (62 g) has 1.5 g of fiber. Green beans (canned) --  cup (83 g) has 2 g of fiber. Sweet potato, baked -- 1 medium (150 g) has 3 g of fiber. Cauliflower (cooked) -- 1/2 cup (90 g) has 2.3 g of fiber. Grains Long-grain brown rice (cooked) -- 1 cup (196 g) has 3.5 g of fiber. Bagel, plain -- one 4-inch (10 cm) bagel has 2 g of fiber. Instant oatmeal --  cup (120 g) has about 2 g of fiber. Macaroni noodles, enriched (cooked) -- 1 cup (140 g) has 2.5 g of fiber. Multigrain cereal --  cup (15 g) has about 2-4 g of fiber. Whole-wheat bread -- 1 slice  (26 g) has 2 g of fiber. Whole-wheat spaghetti noodles --  cup (70 g) has 3.2 g of fiber. Corn tortilla -- one 6-inch (15 cm) tortilla has 1.5 g of fiber. Meats and other proteins Almonds --  cup or 1 oz (28 g) has 3.5 g of fiber. Sunflower seeds in shell --  cup or  oz (11.5 g) has 1.1 g of fiber. Vegetable or soy patty -- 1 patty (70 g) has 3.4 g of fiber. Walnuts --  cup or 1 oz (30 g) has 2 g of fiber. Flax seed -- 1 Tbsp (7 g) has 2.8 g of fiber. The items listed above may not be a complete list of foods with moderate amounts of fiber. Actual amounts of fiber may be different depending on processing. Contact a dietitian for more information. What foods are low in fiber?  Low-fiber foods contain less than 1 g of fiber per serving. They include: Fruits Fruit juice --  cup or 4 fl oz (118 mL) has 0.5 g of fiber. Vegetables Lettuce -- 1 cup (35 g) has 0.5 g of fiber. Cucumber (slices) --  cup (60 g) has 0.3 g of fiber. Celery -- 1 stalk (40 g) has 0.1 g of fiber. Grains Flour tortilla -- one 6-inch (15 cm) tortilla has 0.5 g of fiber. White rice (cooked) --  cup (81.5 g) has 0.3 g of fiber. Meats and other proteins Egg -- 1 large (50 g) has 0 g of fiber. Meat, poultry, or fish -- 3 oz (85 g) has 0 g of fiber. Dairy Milk -- 1 cup or 8 fl oz (237 mL) has 0 g of fiber. Yogurt -- 1 cup (245 g) has 0 g of fiber. The items listed above may not be a complete list of foods that are low in fiber. Actual amounts of fiber may be different depending on processing. Contact a dietitian for more information. This information is not intended to replace advice given to you by your health care provider. Make sure you discuss any questions you have with your health care provider. Document Revised: 07/19/2022 Document Reviewed: 07/19/2022 Elsevier Patient Education  2024 [employer].

## 2023-10-10 ENCOUNTER — Encounter: Payer: Self-pay | Admitting: Internal Medicine

## 2023-10-10 ENCOUNTER — Ambulatory Visit: Admitting: Internal Medicine

## 2023-10-14 ENCOUNTER — Other Ambulatory Visit: Payer: Self-pay | Admitting: Internal Medicine

## 2023-10-17 ENCOUNTER — Other Ambulatory Visit: Payer: Self-pay | Admitting: Internal Medicine

## 2023-10-17 DIAGNOSIS — G8929 Other chronic pain: Secondary | ICD-10-CM

## 2023-10-17 DIAGNOSIS — R590 Localized enlarged lymph nodes: Secondary | ICD-10-CM

## 2023-10-18 NOTE — Telephone Encounter (Signed)
 Requested Prescriptions  Pending Prescriptions Disp Refills   naproxen  (NAPROSYN ) 500 MG tablet [Pharmacy Med Name: NAPROXEN  500 MG TABLET] 30 tablet 1    Sig: TAKE 1 TABLET (500 MG TOTAL) BY MOUTH DAILY AS NEEDED FOR MODERATE PAIN (PAIN SCORE 4-6).     Analgesics:  NSAIDS Failed - 10/18/2023  9:47 AM      Failed - Manual Review: Labs are only required if the patient has taken medication for more than 8 weeks.      Failed - HCT in normal range and within 360 days    HCT  Date Value Ref Range Status  05/05/2023 50.5 (H) 38.5 - 50.0 % Final         Passed - Cr in normal range and within 360 days    Creat  Date Value Ref Range Status  05/05/2023 0.86 0.60 - 1.29 mg/dL Final         Passed - HGB in normal range and within 360 days    Hemoglobin  Date Value Ref Range Status  05/05/2023 16.9 13.2 - 17.1 g/dL Final         Passed - PLT in normal range and within 360 days    Platelets  Date Value Ref Range Status  05/05/2023 290 140 - 400 Thousand/uL Final         Passed - eGFR is 30 or above and within 360 days    GFR calc Af Amer  Date Value Ref Range Status  11/24/2016 >60 >60 mL/min Final    Comment:    (NOTE) The eGFR has been calculated using the CKD EPI equation. This calculation has not been validated in all clinical situations. eGFR's persistently <60 mL/min signify possible Chronic Kidney Disease.    GFR calc non Af Amer  Date Value Ref Range Status  11/24/2016 >60 >60 mL/min Final   GFR  Date Value Ref Range Status  11/28/2015 84.88 >60.00 mL/min Final   eGFR  Date Value Ref Range Status  05/05/2023 110 > OR = 60 mL/min/1.72m2 Final         Passed - Patient is not pregnant      Passed - Valid encounter within last 12 months    Recent Outpatient Visits           2 weeks ago Anxiety   [facility] [place] Cid, [place]   2 months ago Anxiety   [facility] [place] Cid, [place]   4 months ago  Anxiety   [facility] [place] Cid, [place]

## 2023-10-31 ENCOUNTER — Encounter: Payer: Self-pay | Admitting: Internal Medicine

## 2023-11-07 ENCOUNTER — Encounter: Payer: Self-pay | Admitting: Internal Medicine

## 2023-11-07 ENCOUNTER — Ambulatory Visit (INDEPENDENT_AMBULATORY_CARE_PROVIDER_SITE_OTHER): Admitting: Internal Medicine

## 2023-11-07 ENCOUNTER — Other Ambulatory Visit: Payer: Self-pay

## 2023-11-07 VITALS — BP 148/86 | HR 95 | Temp 99.0°F | Resp 18 | Ht 69.0 in | Wt 248.4 lb

## 2023-11-07 DIAGNOSIS — Z0289 Encounter for other administrative examinations: Secondary | ICD-10-CM | POA: Diagnosis not present

## 2023-11-07 DIAGNOSIS — F419 Anxiety disorder, unspecified: Secondary | ICD-10-CM | POA: Diagnosis not present

## 2023-11-07 NOTE — Progress Notes (Signed)
 Established Patient Office Visit  Subjective   Patient ID: Randall Stanley, male    DOB: June 04, 1980  Age: 43 y.o. MRN: [id number]  Chief Complaint  Patient presents with   Follow-up    Discuss work accomodation paperwork    Anxiety Symptoms include nervous/anxious behavior.    Patient is in today for work accomodation forms regarding anxiety.   Discussed the use of AI scribe software for clinical note transcription with the patient, who gave verbal consent to proceed.  History of Present Illness  Randall Stanley is a 43 year old male with severe anxiety who presents with difficulty managing work-related stress and social interactions.  He experiences severe anxiety in social situations and at work, affecting his concentration, interactions, and ability to speak. Crowded work environments, especially on Fridays, exacerbate his symptoms, leading to feelings of being overwhelmed and necessitating breaks to regain composure. His job involves frequent interaction and physical tasks, which are challenging due to the noise and constant activity.  He avoids social activities like going to [place] or the movies with his children to prevent feeling unwell. This avoidance causes tension with his wife, contributing to his stress. He has applied for another job due to work-related stress but is hesitant to leave due to potential benefits at his current position. His primary concerns are related to anxiety impacting his mental health and daily functioning.  Anxiety: -Current Treatments:Prozac  20 mg and Hydroxyzine  PRN -Past Treatments: nothing, mother does well on Prozac  -Counseling: no   Review of Systems  Psychiatric/Behavioral:  The patient is nervous/anxious.   All other systems reviewed and are negative.     Objective:     BP (!) 148/86   Pulse 95   Temp 99 F (37.2 C) (Oral)   Resp 18   Ht 5' 9 (1.753 m)   Wt 248 lb 6.4 oz (112.7 kg)   SpO2 98%   BMI 36.68 kg/m  BP  Readings from Last 3 Encounters:  11/07/23 (!) 148/86  09/29/23 120/80  08/18/23 122/74   Wt Readings from Last 3 Encounters:  11/07/23 248 lb 6.4 oz (112.7 kg)  09/29/23 247 lb 3.2 oz (112.1 kg)  08/18/23 247 lb 4.8 oz (112.2 kg)    Physical Exam Constitutional:      Appearance: Normal appearance.  HENT:     Head: Normocephalic and atraumatic.   Eyes:     Conjunctiva/sclera: Conjunctivae normal.    Cardiovascular:     Rate and Rhythm: Normal rate and regular rhythm.  Pulmonary:     Effort: Pulmonary effort is normal.     Breath sounds: Normal breath sounds.   Skin:    General: Skin is warm and dry.   Neurological:     General: No focal deficit present.     Mental Status: He is alert. Mental status is at baseline.   Psychiatric:        Mood and Affect: Mood normal.        Behavior: Behavior normal.      No results found for any visits on 11/07/23.  Last CBC Lab Results  Component Value Date   WBC 7.9 05/05/2023   HGB 16.9 05/05/2023   HCT 50.5 (H) 05/05/2023   MCV 88.4 05/05/2023   MCH 29.6 05/05/2023   RDW 12.3 05/05/2023   PLT 290 05/05/2023   Last metabolic panel Lab Results  Component Value Date   GLUCOSE 82 05/05/2023   NA 140 05/05/2023   K 4.9 05/05/2023  CL 103 05/05/2023   CO2 30 05/05/2023   BUN 9 05/05/2023   CREATININE 0.86 05/05/2023   EGFR 110 05/05/2023   CALCIUM 9.5 05/05/2023   PROT 7.6 05/05/2023   ALBUMIN 4.4 11/28/2015   BILITOT 0.4 05/05/2023   ALKPHOS 48 11/28/2015   AST 28 05/05/2023   ALT 42 05/05/2023   ANIONGAP 7 11/24/2016   Last lipids Lab Results  Component Value Date   CHOL 138 05/05/2023   HDL 42 05/05/2023   LDLCALC 79 05/05/2023   TRIG 83 05/05/2023   CHOLHDL 3.3 05/05/2023   Last hemoglobin A1c Lab Results  Component Value Date   HGBA1C 6.3 (H) 05/05/2023   Last thyroid  functions No results found for: TSH, T3TOTAL, T4TOTAL, THYROIDAB Last vitamin D No results found for: 25OHVITD2,  25OHVITD3, VD25OH Last vitamin B12 and Folate No results found for: VITAMINB12, FOLATE    The 10-year ASCVD risk score (Arnett DK, et al., 2019) is: 1.5%    Assessment & Plan:   Assessment & Plan  Severe Social Anxiety Disorder Severe social anxiety impairs job performance and personal life, necessitating workplace accommodations. - Documented severe social anxiety disorder for workplace accommodation. - Recommended accommodations: work away from groups, private office, avoid customer interactions.   Return if symptoms worsen or fail to improve.    Sharyle Fischer, DO

## 2023-11-09 ENCOUNTER — Emergency Department
Admission: EM | Admit: 2023-11-09 | Discharge: 2023-11-09 | Disposition: A | Discharge status: Home / Self Care | Attending: Emergency Medicine | Admitting: Emergency Medicine

## 2023-11-09 ENCOUNTER — Other Ambulatory Visit: Payer: Self-pay

## 2023-11-09 ENCOUNTER — Emergency Department

## 2023-11-09 DIAGNOSIS — N200 Calculus of kidney: Secondary | ICD-10-CM

## 2023-11-09 DIAGNOSIS — N132 Hydronephrosis with renal and ureteral calculous obstruction: Secondary | ICD-10-CM | POA: Diagnosis not present

## 2023-11-09 DIAGNOSIS — R109 Unspecified abdominal pain: Secondary | ICD-10-CM | POA: Diagnosis present

## 2023-11-09 LAB — COMPREHENSIVE METABOLIC PANEL WITH GFR
ALT: 48 U/L — ABNORMAL HIGH (ref 0–44)
AST: 32 U/L (ref 15–41)
Albumin: 4.2 g/dL (ref 3.5–5.0)
Alkaline Phosphatase: 39 U/L (ref 38–126)
Anion gap: 9 (ref 5–15)
BUN: 14 mg/dL (ref 6–20)
CO2: 28 mmol/L (ref 22–32)
Calcium: 9.2 mg/dL (ref 8.9–10.3)
Chloride: 100 mmol/L (ref 98–111)
Creatinine, Ser: 1.03 mg/dL (ref 0.61–1.24)
GFR, Estimated: >60 mL/min (ref >60)
Glucose, Bld: 145 mg/dL — ABNORMAL HIGH (ref 70–99)
Potassium: 4 mmol/L (ref 3.5–5.1)
Sodium: 137 mmol/L (ref 135–145)
Total Bilirubin: 0.6 mg/dL (ref 0.0–1.2)
Total Protein: 7.6 g/dL (ref 6.5–8.1)

## 2023-11-09 LAB — CBC
HCT: 47.3 % (ref 39.0–52.0)
Hemoglobin: 16.1 g/dL (ref 13.0–17.0)
MCH: 30.3 pg (ref 26.0–34.0)
MCHC: 34 g/dL (ref 30.0–36.0)
MCV: 89.1 fL (ref 80.0–100.0)
Platelets: 263 10*3/uL (ref 150–400)
RBC: 5.31 MIL/uL (ref 4.22–5.81)
RDW: 12.3 % (ref 11.5–15.5)
WBC: 8.5 10*3/uL (ref 4.0–10.5)
nRBC: 0 % (ref 0.0–0.2)

## 2023-11-09 LAB — LIPASE, BLOOD: Lipase: 148 U/L — ABNORMAL HIGH (ref 11–51)

## 2023-11-09 MED ORDER — IOHEXOL 350 MG/ML SOLN
100.0000 mL | Freq: Once | INTRAVENOUS | Status: AC | PRN
  Administered 2023-11-09: 100 mL via INTRAVENOUS

## 2023-11-09 MED ORDER — MORPHINE SULFATE (PF) 4 MG/ML IV SOLN
4.0000 mg | Freq: Once | INTRAVENOUS | Status: AC
  Administered 2023-11-09: 4 mg via INTRAVENOUS
  Filled 2023-11-09: qty 1

## 2023-11-09 MED ORDER — ONDANSETRON HCL 4 MG/2ML IJ SOLN
4.0000 mg | Freq: Once | INTRAMUSCULAR | Status: AC
  Administered 2023-11-09: 4 mg via INTRAVENOUS
  Filled 2023-11-09: qty 2

## 2023-11-09 NOTE — Discharge Instructions (Addendum)
 Your lipase level was mildly elevated but your CT scan did not show any inflammation of your pancreas. Please follow up with your PCP regarding this nonspecific elevation

## 2023-11-09 NOTE — ED Triage Notes (Signed)
 Pt to ED via POV c/o right flank pain started yesterday afternoon. Pt also states urine trickles when he uses bathroom. Denies burning with urination. Hx of kidney stones

## 2023-11-09 NOTE — ED Provider Notes (Signed)
 [facility] Provider Note    Event Date/Time   First MD Initiated Contact with Patient 11/09/23 0141     (approximate)   History   Flank Pain   HPI  Randall Stanley is a 43 y.o. male with reported history of kidney stone who presents with complaints of right flank pain which started earlier today.  Pain was severe but has improved.     Physical Exam   Triage Vital Signs: ED Triage Vitals  Encounter Vitals Group     BP 11/09/23 0133 (!) 142/92     Girls Systolic BP Percentile --      Girls Diastolic BP Percentile --      Boys Systolic BP Percentile --      Boys Diastolic BP Percentile --      Pulse Rate 11/09/23 0133 79     Resp 11/09/23 0133 18     Temp 11/09/23 0133 97.8 F (36.6 C)     Temp Source 11/09/23 0133 Oral     SpO2 11/09/23 0133 97 %     Weight 11/09/23 0132 112 kg (247 lb)     Height 11/09/23 0132 1.753 m (5' 9)     Head Circumference --      Peak Flow --      Pain Score 11/09/23 0132 0     Pain Loc --      Pain Education --      Exclude from Growth Chart --     Most recent vital signs: Vitals:   11/09/23 0133  BP: (!) 142/92  Pulse: 79  Resp: 18  Temp: 97.8 F (36.6 C)  SpO2: 97%     General: Awake, no distress.  CV:  Good peripheral perfusion.  Resp:  Normal effort.  Abd:  No distention.  No CVA tenderness, no right lower quadrant tenderness to palpation Other:     ED Results / Procedures / Treatments   Labs (all labs ordered are listed, but only abnormal results are displayed) Labs Reviewed  LIPASE, BLOOD - Abnormal; Notable for the following components:      Result Value   Lipase 148 (*)    All other components within normal limits  COMPREHENSIVE METABOLIC PANEL WITH GFR - Abnormal; Notable for the following components:   Glucose, Bld 145 (*)    ALT 48 (*)    All other components within normal limits  CBC  URINALYSIS, ROUTINE W REFLEX MICROSCOPIC     EKG     RADIOLOGY CT scan viewed  interpret by me, no acute abnormality    PROCEDURES:  Critical Care performed:   Procedures   MEDICATIONS ORDERED IN ED: Medications  morphine (PF) 4 MG/ML injection 4 mg (4 mg Intravenous Given 11/09/23 0236)  ondansetron  (ZOFRAN ) injection 4 mg (4 mg Intravenous Given 11/09/23 0234)  iohexol  (OMNIPAQUE ) 350 MG/ML injection 100 mL (100 mLs Intravenous Contrast Given 11/09/23 0242)     IMPRESSION / MDM / ASSESSMENT AND PLAN / ED COURSE  I reviewed the triage vital signs and the nursing notes. Patient's presentation is most consistent with acute presentation with potential threat to life or bodily function.  Patient presents with right flank pain, differential includes ureterolithiasis, UTI, pyelonephritis, appendicitis  Will treat with IV morphine, Zofran , lab work are reviewed overall reassuring, pending urinalysis  Pending CT scan  ----------------------------------------- 3:13 AM on 11/09/2023 ----------------------------------------- CT scan most consistent with recently passed stone, on reevaluation patient is feeling much improved  No indication  for admission at this time, appropriate for discharge, outpatient follow-up     FINAL CLINICAL IMPRESSION(S) / ED DIAGNOSES   Final diagnoses:  Kidney stone     Rx / DC Orders   ED Discharge Orders     None        Note:  This document was prepared using Dragon voice recognition software and may include unintentional dictation errors.   Randall Charleston, MD 11/09/23 [phone]

## 2023-11-15 ENCOUNTER — Ambulatory Visit: Admitting: Internal Medicine

## 2023-11-17 ENCOUNTER — Ambulatory Visit (INDEPENDENT_AMBULATORY_CARE_PROVIDER_SITE_OTHER): Admitting: Internal Medicine

## 2023-11-17 ENCOUNTER — Encounter: Payer: Self-pay | Admitting: Internal Medicine

## 2023-11-17 ENCOUNTER — Other Ambulatory Visit: Payer: Self-pay

## 2023-11-17 VITALS — BP 122/78 | HR 69 | Temp 98.4°F | Resp 16 | Ht 69.0 in | Wt 250.0 lb

## 2023-11-17 DIAGNOSIS — F419 Anxiety disorder, unspecified: Secondary | ICD-10-CM | POA: Diagnosis not present

## 2023-11-17 NOTE — Progress Notes (Signed)
 Established Patient Office Visit  Subjective   Patient ID: Randall Stanley, male    DOB: [birth year]  Age: 43 y.o. MRN: [id number]  Chief Complaint  Patient presents with   Medical Management of Chronic Issues    3 month recheck    Anxiety Symptoms include nervous/anxious behavior.    Patient is in today for follow up on anxiety.   History of Present Illness  Discussed the use of AI scribe software for clinical note transcription with the patient, who gave verbal consent to proceed.  Randall Stanley is a 43 year old male who presents for a follow-up visit after passing a kidney stone.  He experienced extreme pain during the passage of the kidney stone, which required an emergency room visit two weeks ago. He is currently taking Prozac  20 mg daily for depression, which he finds effective. He is experiencing significant stress at work due to interactions with a new supervisor, whom he feels is treating him unfairly. He is documenting these interactions and has taken vacation time to manage stress.      09/29/2023    1:07 PM 08/18/2023    2:26 PM 06/20/2023    2:46 PM 05/05/2023    7:48 AM 03/19/2022    3:27 PM  Depression screen PHQ 2/9  Decreased Interest 2 1 1 2  0  Down, Depressed, Hopeless 1 1 1 3  0  PHQ - 2 Score 3 2 2 5  0  Altered sleeping 2 1 2 3  0  Tired, decreased energy 2 1 1 3  0  Change in appetite 3 1 1 3  0  Feeling bad or failure about yourself  2 0 1 3 0  Trouble concentrating 2 1 1  0 0  Moving slowly or fidgety/restless 2 0 2 3 0  Suicidal thoughts 0 0 0 0 0  PHQ-9 Score 16 6 10 20  0  Difficult doing work/chores Very difficult Somewhat difficult Not difficult at all Somewhat difficult Not difficult at all   -Current Treatments:Prozac  20 mg and Hydroxyzine  PRN -Past Treatments: nothing, mother does well on Prozac  -Counseling: no   Review of Systems  Psychiatric/Behavioral:  The patient is nervous/anxious.   All other systems reviewed and are  negative.     Objective:     BP 122/78 (Cuff Size: Large)   Pulse 69   Temp 98.4 F (36.9 C) (Oral)   Resp 16   Ht 5' 9 (1.753 m)   Wt 250 lb (113.4 kg)   SpO2 96%   BMI 36.92 kg/m  BP Readings from Last 3 Encounters:  11/17/23 122/78  11/09/23 (!) 142/92  11/07/23 (!) 148/86   Wt Readings from Last 3 Encounters:  11/17/23 250 lb (113.4 kg)  11/09/23 247 lb (112 kg)  11/07/23 248 lb 6.4 oz (112.7 kg)    Physical Exam Constitutional:      Appearance: Normal appearance.  HENT:     Head: Normocephalic and atraumatic.  Eyes:     Conjunctiva/sclera: Conjunctivae normal.  Cardiovascular:     Rate and Rhythm: Normal rate and regular rhythm.  Pulmonary:     Effort: Pulmonary effort is normal.     Breath sounds: Normal breath sounds.  Skin:    General: Skin is warm and dry.  Neurological:     General: No focal deficit present.     Mental Status: He is alert. Mental status is at baseline.  Psychiatric:        Mood and Affect: Mood normal.  Behavior: Behavior normal.      No results found for any visits on 11/17/23.  Last CBC Lab Results  Component Value Date   WBC 8.5 11/09/2023   HGB 16.1 11/09/2023   HCT 47.3 11/09/2023   MCV 89.1 11/09/2023   MCH 30.3 11/09/2023   RDW 12.3 11/09/2023   PLT 263 11/09/2023   Last metabolic panel Lab Results  Component Value Date   GLUCOSE 145 (H) 11/09/2023   NA 137 11/09/2023   K 4.0 11/09/2023   CL 100 11/09/2023   CO2 28 11/09/2023   BUN 14 11/09/2023   CREATININE 1.03 11/09/2023   EGFR 110 05/05/2023   CALCIUM 9.2 11/09/2023   PROT 7.6 11/09/2023   ALBUMIN 4.2 11/09/2023   BILITOT 0.6 11/09/2023   ALKPHOS 39 11/09/2023   AST 32 11/09/2023   ALT 48 (H) 11/09/2023   ANIONGAP 9 11/09/2023   Last lipids Lab Results  Component Value Date   CHOL 138 05/05/2023   HDL 42 05/05/2023   LDLCALC 79 05/05/2023   TRIG 83 05/05/2023   CHOLHDL 3.3 05/05/2023   Last hemoglobin A1c Lab Results  Component  Value Date   HGBA1C 6.3 (H) 05/05/2023   Last thyroid  functions No results found for: TSH, T3TOTAL, T4TOTAL, THYROIDAB Last vitamin D No results found for: 25OHVITD2, 25OHVITD3, VD25OH Last vitamin B12 and Folate No results found for: VITAMINB12, FOLATE    The 10-year ASCVD risk score (Arnett DK, et al., 2019) is: 1.1%    Assessment & Plan:   Assessment & Plan Anxiety/Work-related Stress Prozac  20 mg effectively controls symptoms. Stress from potential job change noted. No medication adjustment desired. - Continue Prozac  20 mg daily. - Follow-up in six months.    Return in about 6 months (around 05/19/2024).    Sharyle Fischer, DO

## 2023-12-06 ENCOUNTER — Ambulatory Visit: Admitting: Clinical

## 2023-12-16 ENCOUNTER — Encounter: Payer: Self-pay | Admitting: Internal Medicine

## 2023-12-16 ENCOUNTER — Ambulatory Visit (INDEPENDENT_AMBULATORY_CARE_PROVIDER_SITE_OTHER): Admitting: Internal Medicine

## 2023-12-16 ENCOUNTER — Other Ambulatory Visit: Payer: Self-pay

## 2023-12-16 VITALS — BP 124/72 | HR 90 | Temp 98.4°F | Resp 16 | Ht 69.0 in | Wt 250.1 lb

## 2023-12-16 DIAGNOSIS — Z0289 Encounter for other administrative examinations: Secondary | ICD-10-CM

## 2023-12-16 DIAGNOSIS — F419 Anxiety disorder, unspecified: Secondary | ICD-10-CM | POA: Diagnosis not present

## 2023-12-16 NOTE — Progress Notes (Signed)
 Established Patient Office Visit  Subjective   Patient ID: Randall Stanley, male    DOB: [birth year]  Age: 43 y.o. MRN: [id number]  Chief Complaint  Patient presents with   Consult    Discuss FMLA paperwork    Anxiety Symptoms include nervous/anxious behavior.    Patient is in today for follow up on anxiety and for FMLA paperwork.   History of Present Illness  Discussed the use of AI scribe software for clinical note transcription with the patient, who gave verbal consent to proceed.  Randall Stanley is a 43 year old male who presents for a recheck on anxiety and work accommodation issues.  He experiences ongoing anxiety since October, following his father's death, with significant emotional distress in crowded work environments. Anxiety symptoms include diarrhea, vomiting, abdominal discomfort, and emotional distress such as a desire to cry. He maintains a positive demeanor at work to avoid attention.  He is on medication for anxiety, though specifics are not discussed. He missed a counseling appointment and plans to reschedule.  He works in a [employer] and finds the crowded environment overwhelming, especially during busy times. He uses a private bathroom for breaks when overwhelmed. He works twelve-hour shifts, three days a week.  Family history includes mental health conditions, with his grandmother having schizophrenia and his sister being bipolar schizophrenic. His partner, April, is concerned about potential similar conditions in him.     09/29/2023    1:07 PM 08/18/2023    2:26 PM 06/20/2023    2:46 PM 05/05/2023    7:48 AM 03/19/2022    3:27 PM  Depression screen PHQ 2/9  Decreased Interest 2 1 1 2  0  Down, Depressed, Hopeless 1 1 1 3  0  PHQ - 2 Score 3 2 2 5  0  Altered sleeping 2 1 2 3  0  Tired, decreased energy 2 1 1 3  0  Change in appetite 3 1 1 3  0  Feeling bad or failure about yourself  2 0 1 3 0  Trouble concentrating 2 1 1  0 0  Moving slowly or  fidgety/restless 2 0 2 3 0  Suicidal thoughts 0 0 0 0 0  PHQ-9 Score 16 6 10 20  0  Difficult doing work/chores Very difficult Somewhat difficult Not difficult at all Somewhat difficult Not difficult at all   -Current Treatments:Prozac  20 mg and Hydroxyzine  PRN -Past Treatments: nothing, mother does well on Prozac  -Counseling: no   Review of Systems  Psychiatric/Behavioral:  The patient is nervous/anxious.   All other systems reviewed and are negative.     Objective:     BP 124/72 (Cuff Size: Large)   Pulse 90   Temp 98.4 F (36.9 C) (Oral)   Resp 16   Ht 5' 9 (1.753 m)   Wt 250 lb 1.6 oz (113.4 kg)   SpO2 99%   BMI 36.93 kg/m  BP Readings from Last 3 Encounters:  12/16/23 124/72  11/17/23 122/78  11/09/23 (!) 142/92   Wt Readings from Last 3 Encounters:  12/16/23 250 lb 1.6 oz (113.4 kg)  11/17/23 250 lb (113.4 kg)  11/09/23 247 lb (112 kg)    Physical Exam Constitutional:      Appearance: Normal appearance.  HENT:     Head: Normocephalic and atraumatic.  Eyes:     Conjunctiva/sclera: Conjunctivae normal.  Cardiovascular:     Rate and Rhythm: Normal rate and regular rhythm.  Pulmonary:     Effort: Pulmonary effort is normal.  Breath sounds: Normal breath sounds.  Skin:    General: Skin is warm and dry.  Neurological:     General: No focal deficit present.     Mental Status: He is alert. Mental status is at baseline.  Psychiatric:        Mood and Affect: Mood normal.        Behavior: Behavior normal.      No results found for any visits on 12/16/23.  Last CBC Lab Results  Component Value Date   WBC 8.5 11/09/2023   HGB 16.1 11/09/2023   HCT 47.3 11/09/2023   MCV 89.1 11/09/2023   MCH 30.3 11/09/2023   RDW 12.3 11/09/2023   PLT 263 11/09/2023   Last metabolic panel Lab Results  Component Value Date   GLUCOSE 145 (H) 11/09/2023   NA 137 11/09/2023   K 4.0 11/09/2023   CL 100 11/09/2023   CO2 28 11/09/2023   BUN 14 11/09/2023    CREATININE 1.03 11/09/2023   EGFR 110 05/05/2023   CALCIUM 9.2 11/09/2023   PROT 7.6 11/09/2023   ALBUMIN 4.2 11/09/2023   BILITOT 0.6 11/09/2023   ALKPHOS 39 11/09/2023   AST 32 11/09/2023   ALT 48 (H) 11/09/2023   ANIONGAP 9 11/09/2023   Last lipids Lab Results  Component Value Date   CHOL 138 05/05/2023   HDL 42 05/05/2023   LDLCALC 79 05/05/2023   TRIG 83 05/05/2023   CHOLHDL 3.3 05/05/2023   Last hemoglobin A1c Lab Results  Component Value Date   HGBA1C 6.3 (H) 05/05/2023   Last thyroid  functions No results found for: TSH, T3TOTAL, T4TOTAL, THYROIDAB Last vitamin D No results found for: 25OHVITD2, 25OHVITD3, VD25OH Last vitamin B12 and Folate No results found for: VITAMINB12, FOLATE    The 10-year ASCVD risk score (Arnett DK, et al., 2019) is: 1.1%    Assessment & Plan:   Assessment & Plan  Anxiety disorder/Form Completion  Chronic anxiety exacerbated by work environment, with gastrointestinal and emotional symptoms. Family history of schizophrenia and bipolar disorder noted. Current medication in use, counseling sessions missed. - Adjust work schedule to 30 hours weekly, half days on Fridays. - Reschedule missed counseling sessions. - Continue current medication regimen.  Return in about 5 months (around 05/17/2024) for physical.    Sharyle Fischer, DO

## 2023-12-27 ENCOUNTER — Other Ambulatory Visit: Payer: Self-pay | Admitting: Internal Medicine

## 2023-12-27 DIAGNOSIS — R590 Localized enlarged lymph nodes: Secondary | ICD-10-CM

## 2023-12-27 DIAGNOSIS — G8929 Other chronic pain: Secondary | ICD-10-CM

## 2023-12-28 NOTE — Telephone Encounter (Signed)
 Requested Prescriptions  Pending Prescriptions Disp Refills   naproxen  (NAPROSYN ) 500 MG tablet [Pharmacy Med Name: NAPROXEN  500 MG TABLET] 30 tablet 1    Sig: TAKE 1 TABLET (500 MG TOTAL) BY MOUTH DAILY AS NEEDED FOR MODERATE PAIN (PAIN SCORE 4-6).     Analgesics:  NSAIDS Failed - 12/28/2023  2:31 PM      Failed - Manual Review: Labs are only required if the patient has taken medication for more than 8 weeks.      Passed - Cr in normal range and within 360 days    Creat  Date Value Ref Range Status  05/05/2023 0.86 0.60 - 1.29 mg/dL Final   Creatinine, Ser  Date Value Ref Range Status  11/09/2023 1.03 0.61 - 1.24 mg/dL Final         Passed - HGB in normal range and within 360 days    Hemoglobin  Date Value Ref Range Status  11/09/2023 16.1 13.0 - 17.0 g/dL Final         Passed - PLT in normal range and within 360 days    Platelets  Date Value Ref Range Status  11/09/2023 263 150 - 400 K/uL Final         Passed - HCT in normal range and within 360 days    HCT  Date Value Ref Range Status  11/09/2023 47.3 39.0 - 52.0 % Final         Passed - eGFR is 30 or above and within 360 days    GFR calc Af Amer  Date Value Ref Range Status  11/24/2016 >60 >60 mL/min Final    Comment:    (NOTE) The eGFR has been calculated using the CKD EPI equation. This calculation has not been validated in all clinical situations. eGFR's persistently <60 mL/min signify possible Chronic Kidney Disease.    GFR, Estimated  Date Value Ref Range Status  11/09/2023 >60 >60 mL/min Final    Comment:    (NOTE) Calculated using the CKD-EPI Creatinine Equation (2021)    GFR  Date Value Ref Range Status  11/28/2015 84.88 >60.00 mL/min Final   eGFR  Date Value Ref Range Status  05/05/2023 110 > OR = 60 mL/min/1.70m2 Final         Passed - Patient is not pregnant      Passed - Valid encounter within last 12 months    Recent Outpatient Visits           1 week ago Anxiety   [facility] Bernardo Fend, DO   1 month ago Anxiety   [facility] Bernardo Fend, DO   1 month ago Anxiety   [facility] Bernardo Fend, DO   3 months ago Anxiety   [facility] Bernardo Fend, DO   4 months ago Anxiety   [facility] Bernardo Fend, DO       Future Appointments             In 4 months Bernardo Fend, DO [facility]

## 2024-02-25 ENCOUNTER — Other Ambulatory Visit: Payer: Self-pay | Admitting: Internal Medicine

## 2024-02-25 DIAGNOSIS — F419 Anxiety disorder, unspecified: Secondary | ICD-10-CM

## 2024-02-28 NOTE — Telephone Encounter (Signed)
 Requested Prescriptions  Pending Prescriptions Disp Refills   FLUoxetine  (PROZAC ) 20 MG capsule [Pharmacy Med Name: FLUOXETINE  HCL 20 MG CAPSULE] 90 capsule 1    Sig: TAKE 1 CAPSULE BY MOUTH EVERY DAY     Psychiatry:  Antidepressants - SSRI Passed - 02/28/2024  9:53 AM      Passed - Valid encounter within last 6 months    Recent Outpatient Visits           2 months ago Anxiety   [facility] Bernardo Fend, DO   3 months ago Anxiety   [facility] Bernardo Fend, DO   3 months ago Anxiety   [facility] Bernardo Fend, DO   5 months ago Anxiety   [facility] Bernardo Fend, DO   6 months ago Anxiety   [facility] Bernardo Fend, DO       Future Appointments             In 2 months Bernardo Fend, DO [facility] [place]

## 2024-04-11 ENCOUNTER — Ambulatory Visit (INDEPENDENT_AMBULATORY_CARE_PROVIDER_SITE_OTHER): Admitting: Family Medicine

## 2024-04-11 ENCOUNTER — Ambulatory Visit: Payer: Self-pay

## 2024-04-11 ENCOUNTER — Encounter: Payer: Self-pay | Admitting: Family Medicine

## 2024-04-11 ENCOUNTER — Telehealth: Payer: Self-pay

## 2024-04-11 VITALS — BP 126/78 | HR 99 | Resp 16 | Ht 69.0 in | Wt 249.1 lb

## 2024-04-11 DIAGNOSIS — F419 Anxiety disorder, unspecified: Secondary | ICD-10-CM | POA: Diagnosis not present

## 2024-04-11 DIAGNOSIS — F4381 Prolonged grief disorder: Secondary | ICD-10-CM | POA: Diagnosis not present

## 2024-04-11 NOTE — Telephone Encounter (Signed)
 FYI Only or Action Required?: FYI only for provider: appointment scheduled on 04/11/2024.  Patient was last seen in primary care on 12/16/2023 by Randall Fend, DO.  Called Nurse Triage reporting Anxiety.  Symptoms began several months ago.  Interventions attempted: Prescription medications: Prozac , Atarax .  Symptoms are: gradually worsening.  Triage Disposition: See Physician Within 24 Hours  Patient/caregiver understands and will follow disposition?: Yes       Copied from CRM #[id number]. Topic: Clinical - Red Word Triage >> Apr 11, 2024  9:28 AM Randall Stanley wrote: [facility] that prompted transfer to Nurse Triage: Increased anxiety due to schedule changes at work. Reason for Disposition  Patient sounds very upset or troubled to the triager  Answer Assessment - Initial Assessment Questions This RN scheduled pt an appointment today. This RN educated pt on new-worsening symptoms and when to call back/seek emergent care. Pt verbalized understanding and agrees to plan.    CONCERN: Did anything happen that prompted you to call today?      Dad passed away a year ago; pt was told he will be working 76 hrs a week until further notice; the holidays  ONSET: How long have you been feeling this way? (Stanley.g., hours, days, weeks)     Anxiety for years and has gotten bad in past year  SEVERITY: How would you rate the level of anxiety? (Stanley.g., 0 - 10; or mild, moderate, severe).     9/10 right now; pt states he is supposed to be at work right now but he couldn't bring himself to get there; I'll probably lose my job. Pt states he has tried for [facility] [employer] but was told the paperwork isn't being filled out right now. Pt states he has been at this job for a year.  FUNCTIONAL IMPAIRMENT: How have these feelings affected your ability to do daily activities? Have you had more difficulty than usual doing your normal daily activities? (Stanley.g., getting better, same, worse; self-care, school, work,  interactions)     Yes, pt states he doesn't go out with his family as much anymore   RISK OF HARM - SUICIDAL IDEATION: Do you ever have thoughts of hurting or killing yourself? If Yes, ask:  Do you have these feelings now? Do you have a plan on how you would do this?     Not in a long time denies SI or HI  TREATMENT:  What has been done so far to treat this anxiety? (Stanley.g., medicines, relaxation strategies). What has helped?     Pt states he takes 2 meds for anxiety: prozac  and atarax ; pt states they help to a a point but makes pt sleepy when taken together  THERAPIST: Do you have a counselor or therapist? If Yes, ask: What is their name?     Pt states he is supposed to be seeing a psychiatrist on 05/17/2024  POTENTIAL TRIGGERS: Do you drink caffeinated beverages (Stanley.g., coffee, colas, teas), and how much daily? Do you drink alcohol or use any drugs? Have you started any new medicines recently?       Drinks coffee once a day; denies alcohol or drugs  PATIENT SUPPORT: Who is with you now? Who do you live with? Do you have family or friends who you can talk to?        Pt's wife, kids, and pt mom  OTHER SYMPTOMS: Do you have any other symptoms? (Stanley.g., feeling depressed, trouble concentrating, trouble sleeping, trouble breathing, palpitations or fast heartbeat, chest pain, sweating, nausea, or  diarrhea)       Intermittent chest pushing when pt has anxiety in social setting (happened yesterday when pt was at mall), sweating; denies difficulty breathing  Protocols used: Anxiety and Panic Attack-A-AH

## 2024-04-11 NOTE — Progress Notes (Signed)
 Name: Randall Stanley   MRN: [id number]    DOB: 1980-08-15   Date:04/11/2024       Progress Note  Subjective  Chief Complaint  Chief Complaint  Patient presents with   Anxiety   Discussed the use of AI scribe software for clinical note transcription with the patient, who gave verbal consent to proceed.  History of Present Illness Randall Stanley is a 43 year old male who presents with anxiety and work-related stress.  He experiences significant anxiety and stress, particularly related to his work environment. He describes a sensation of 'pressure, like a burden' and has anxiety and panic attacks, especially in large groups. Crowded situations make him physically ill, causing vomiting. He takes fluoxetine  and hydroxyzine  to manage symptoms, but his anxiety has worsened since October, around the holidays.  He has a history of witnessing his father's decline and death, which he describes as traumatic. His father passed away last year after a rapid decline over 90 days due to complications from a ruptured vein, leading to a GI bleed and elevated ammonia levels. This experience has contributed to his current mental health struggles.  He faces challenges at work, where mandatory overtime up to 80 hours a week exacerbates his anxiety. He previously had an agreement to work weekends to avoid crowds, but this has been revoked. He has attempted to obtain FMLA leave but has been denied twice.  He reports difficulty sleeping, waking frequently and not feeling rested. He also mentions overeating despite not feeling hungry, attributing this to his anxiety. He describes feeling a pressure or burden, and when asked, also endorsed some breathing symptoms.    Patient Active Problem List   Diagnosis Date Noted   Prediabetes 06/20/2023   Posterior cervical lymphadenopathy 02/08/2022   History of prediabetes 02/08/2022   History of gout 02/08/2022   Anxiety 02/08/2022   Preventative health care 12/11/2015    GERD (gastroesophageal reflux disease) 09/05/2015   Gout 09/05/2015    Past Surgical History:  Procedure Laterality Date   CARPAL TUNNEL WITH CUBITAL TUNNEL Right 06/01/2019   Procedure: RIGHT CARPAL TUNNEL AND RIGHT ULNAR NERVE DECOMPRESSION AT RIGHT ELBOW;  Surgeon: Murrell Drivers, MD;  Location:  SURGERY CENTER;  Service: Orthopedics;  Laterality: Right;  hand and upper arm    Family History  Problem Relation Age of Onset   Parkinson's disease Maternal Grandmother    Hyperlipidemia Father    Stroke Father    Rheumatic fever Father    Heart attack Father    Kidney failure Father    Diabetes Father    COPD Mother     Social History   Tobacco Use   Smoking status: Never   Smokeless tobacco: Never  Substance Use Topics   Alcohol use: Yes    Alcohol/week: 0.0 standard drinks of alcohol    Comment: occasional     Current Outpatient Medications:    FLUoxetine  (PROZAC ) 20 MG capsule, TAKE 1 CAPSULE BY MOUTH EVERY DAY, Disp: 90 capsule, Rfl: 1   hydrOXYzine  (ATARAX ) 10 MG tablet, TAKE 1 TABLET (10 MG TOTAL) BY MOUTH AT BEDTIME AS NEEDED FOR ANXIETY, Disp: 30 tablet, Rfl: 0   naproxen  (NAPROSYN ) 500 MG tablet, TAKE 1 TABLET (500 MG TOTAL) BY MOUTH DAILY AS NEEDED FOR MODERATE PAIN (PAIN SCORE 4-6)., Disp: 30 tablet, Rfl: 1  No Known Allergies  I personally reviewed active problem list, medication list, allergies, family history with the patient/caregiver today.   ROS  Ten systems reviewed and  is negative except as mentioned in HPI    Objective Physical Exam CONSTITUTIONAL: Patient appears well-developed and well-nourished.  No distress. HEENT: Head atraumatic, normocephalic, neck supple. CARDIOVASCULAR: Normal rate, regular rhythm and normal heart sounds.  No murmur heard. No BLE edema. PULMONARY: Effort normal and breath sounds normal. No respiratory distress. ABDOMINAL: There is no tenderness or distention. MUSCULOSKELETAL: Normal gait. Without gross motor  or sensory deficit. PSYCHIATRIC: Patient has a normal mood and affect. behavior is normal. Judgment and thought content normal.  Vitals:   04/11/24 1305  BP: 126/78  Pulse: 99  Resp: 16  SpO2: 96%  Weight: 249 lb 1.6 oz (113 kg)  Height: 5' 9 (1.753 m)    Body mass index is 36.79 kg/m.    PHQ2/9:    04/11/2024    1:11 PM 09/29/2023    1:07 PM 08/18/2023    2:26 PM 06/20/2023    2:46 PM 05/05/2023    7:48 AM  Depression screen PHQ 2/9  Decreased Interest 3 2 1 1 2   Down, Depressed, Hopeless 3 1 1 1 3   PHQ - 2 Score 6 3 2 2 5   Altered sleeping 3 2 1 2 3   Tired, decreased energy 3 2 1 1 3   Change in appetite 3 3 1 1 3   Feeling bad or failure about yourself  2 2 0 1 3  Trouble concentrating 3 2 1 1  0  Moving slowly or fidgety/restless 3 2 0 2 3  Suicidal thoughts 0 0 0 0 0  PHQ-9 Score 23 16  6  10  20    Difficult doing work/chores Very difficult Very difficult Somewhat difficult Not difficult at all Somewhat difficult     Data saved with a previous flowsheet row definition    phq 9 is positive  Fall Risk:    04/11/2024    1:03 PM 09/29/2023    1:01 PM 05/05/2023    7:43 AM 03/19/2022    3:27 PM 02/08/2022    9:08 AM  Fall Risk   Falls in the past year? 0 0 0 0 0  Number falls in past yr: 0 0 0 0 0  Injury with Fall? 0 0  0  0  0   Risk for fall due to : No Fall Risks No Fall Risks No Fall Risks No Fall Risks   Follow up Falls evaluation completed Falls prevention discussed;Education provided;Falls evaluation completed Falls evaluation completed Education provided;Falls evaluation completed;Falls prevention discussed       Data saved with a previous flowsheet row definition      Assessment & Plan  Anxiety disorder Exacerbated by work stress and social situations, leading to panic attacks and physical symptoms. Current medication regimen insufficient during stress. Difficulty with FMLA due to employer's refusal and lack of psychiatric support. - Continue  fluoxetine  and hydroxyzine . - Referred to psychiatrist for further evaluation and management. - keep visit with therapist that is scheduled for next month  - Advised to avoid large groups to prevent symptoms. - Provided work excuse until specialist appointment.  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 - Referred to psychiatrist for evaluation and management. - Encouraged continuation of therapy sessions.  Return in 2 weeks to see Dr. Bernardo PCP

## 2024-04-11 NOTE — Telephone Encounter (Signed)
 This was changed to a Nurse Triage encounter.   Copied from CRM #[id number]. Topic: Clinical - Red Word Triage >> Apr 11, 2024  9:28 AM Adelita E wrote: [facility] that prompted transfer to Nurse Triage: Increased anxiety due to schedule changes at work.

## 2024-04-18 ENCOUNTER — Ambulatory Visit: Payer: Self-pay

## 2024-04-18 NOTE — Telephone Encounter (Signed)
 FYI Only or Action Required?: FYI only for provider: appointment scheduled on 04/19/24.  Patient was last seen in primary care on 04/11/2024 by Glenard Mire, MD.  Called Nurse Triage reporting Anxiety.  Symptoms began on going.  Interventions attempted: Prescription medications: Prozac , hydroxyzine , citalopram.  Symptoms are: gradually worsening.  Triage Disposition: Information or Advice Only Call  Patient/caregiver understands and will follow disposition?: Yes Reason for Disposition  Health information question, no triage required and triager able to answer question  Answer Assessment - Initial Assessment Questions Patient states the psychologist prescribed him Citalopram 20mg  1x daily in the am. Was advised to take Prozac  and hydroxyzine  and citalopram together for 3 days then stop and just continue the citalopram.   1. REASON FOR CALL: What is the main reason for your call? or How can I best help you?     Disability sent in paperwork to PCP office on 12/5, patient looking to make appointment to go over paperwork with PCP as he stated she likes to do it with patient.  2. SYMPTOMS : Do you have any symptoms?      Anxiety, gets sick when going out, causes stomach upset and diarrhea.  Protocols used: Information Only Call - No Triage-A-AH  Copied from CRM [id number]. Topic: Clinical - Red Word Triage >> Apr 18, 2024  9:43 AM Jayma L wrote: Red Word that prompted transfer to Nurse Triage: patient called in stated his anxiety is worsening in the past 2 weeks, said when he gets out he gets sick and he will have stomach issues and it makes him have diaherra and throw up.

## 2024-04-19 ENCOUNTER — Encounter: Payer: Self-pay | Admitting: Internal Medicine

## 2024-04-19 ENCOUNTER — Other Ambulatory Visit: Payer: Self-pay

## 2024-04-19 ENCOUNTER — Ambulatory Visit (INDEPENDENT_AMBULATORY_CARE_PROVIDER_SITE_OTHER): Admitting: Internal Medicine

## 2024-04-19 VITALS — BP 120/72 | HR 86 | Temp 98.2°F | Resp 16 | Ht 69.0 in | Wt 249.8 lb

## 2024-04-19 DIAGNOSIS — F4310 Post-traumatic stress disorder, unspecified: Secondary | ICD-10-CM

## 2024-04-19 DIAGNOSIS — F419 Anxiety disorder, unspecified: Secondary | ICD-10-CM

## 2024-04-19 DIAGNOSIS — Z0289 Encounter for other administrative examinations: Secondary | ICD-10-CM

## 2024-04-19 NOTE — Progress Notes (Signed)
 Acute Office Visit  Subjective:     Patient ID: Randall Stanley, male    DOB: 1981/01/26, 43 y.o.   MRN: [id number]  Chief Complaint  Patient presents with   Consult    Discuss papwerwork   Anxiety    HPI Patient is in today for paperwork.   Discussed the use of AI scribe software for clinical note transcription with the patient, who gave verbal consent to proceed.  History of Present Illness Randall Stanley is a 43 year old male with anxiety and PTSD who presents for management of his behavioral health conditions and FMLA paperwork.  He describes severe anxiety that impairs sleep and social interactions, with associated nausea, diarrhea, vomiting, sweating, lightheadedness, and fatigue. Crowded places trigger panic attacks three to four times weekly, lasting about 20 minutes. He has difficulty with decision-making and focus. He has been off work and on FMLA/short-term disability since December 3rd due to these symptoms and is here to address ongoing management and required paperwork. His stress is increased by his wife Randall Stanley's recent precancerous polyps and strong family cancer history on her side.   Review of Systems  Psychiatric/Behavioral:  The patient is nervous/anxious and has insomnia.         Objective:    BP 120/72 (Cuff Size: Large)   Pulse 86   Temp 98.2 F (36.8 C) (Oral)   Resp 16   Ht 5' 9 (1.753 m)   Wt 249 lb 12.8 oz (113.3 kg)   SpO2 98%   BMI 36.89 kg/m  BP Readings from Last 3 Encounters:  04/19/24 120/72  04/11/24 126/78  12/16/23 124/72   Wt Readings from Last 3 Encounters:  04/19/24 249 lb 12.8 oz (113.3 kg)  04/11/24 249 lb 1.6 oz (113 kg)  12/16/23 250 lb 1.6 oz (113.4 kg)      Physical Exam Constitutional:      Appearance: Normal appearance.  HENT:     Head: Normocephalic and atraumatic.  Eyes:     Conjunctiva/sclera: Conjunctivae normal.  Cardiovascular:     Rate and Rhythm: Normal rate and regular rhythm.  Pulmonary:      Effort: Pulmonary effort is normal.     Breath sounds: Normal breath sounds.  Skin:    General: Skin is warm and dry.  Neurological:     General: No focal deficit present.     Mental Status: He is alert. Mental status is at baseline.  Psychiatric:        Mood and Affect: Mood is anxious.        Speech: Speech normal.        Behavior: Behavior is cooperative.        Thought Content: Thought content normal.     No results found for any visits on 04/19/24.      Assessment & Plan:   Assessment & Plan Anxiety disorder Chronic anxiety with severe symptoms triggered by social interactions. Current treatment with Celexa 20 mg and lifestyle modifications. - Continue Celexa 20 mg daily. - Encouraged therapy workshop participation. - Encouraged exercise regimen.  Post-traumatic stress disorder Recently diagnosed PTSD with severe anxiety and social avoidance. Current treatment includes Celexa 20 mg and planned cognitive behavioral therapy. - Continue Celexa 20 mg daily. - Initiate cognitive behavioral therapy.  Encounter for Forms - FMLA and short term disability forms filled out until 05/16/24. Discussed that further disability will need to be filled out by specialist.      Return for already scheduled, can  canecel 1/6 apt. SABRA Sharyle Fischer, DO

## 2024-05-07 ENCOUNTER — Other Ambulatory Visit: Payer: Self-pay

## 2024-05-07 ENCOUNTER — Ambulatory Visit (INDEPENDENT_AMBULATORY_CARE_PROVIDER_SITE_OTHER): Admitting: Internal Medicine

## 2024-05-07 ENCOUNTER — Encounter: Payer: Self-pay | Admitting: Internal Medicine

## 2024-05-07 VITALS — BP 124/80 | HR 75 | Temp 98.8°F | Resp 16 | Ht 69.0 in | Wt 248.8 lb

## 2024-05-07 DIAGNOSIS — F4310 Post-traumatic stress disorder, unspecified: Secondary | ICD-10-CM

## 2024-05-07 DIAGNOSIS — F419 Anxiety disorder, unspecified: Secondary | ICD-10-CM | POA: Diagnosis not present

## 2024-05-07 DIAGNOSIS — R7303 Prediabetes: Secondary | ICD-10-CM

## 2024-05-07 DIAGNOSIS — Z13220 Encounter for screening for lipoid disorders: Secondary | ICD-10-CM | POA: Diagnosis not present

## 2024-05-07 NOTE — Progress Notes (Signed)
 "  Acute Office Visit  Subjective:     Patient ID: Randall Stanley, male    DOB: [birth year], 43 y.o.   MRN: [id number]  Chief Complaint  Patient presents with   Medical Management of Chronic Issues    HPI Patient is in today for paperwork.   Discussed the use of AI scribe software for clinical note transcription with the patient, who gave verbal consent to proceed.  History of Present Illness  Randall Stanley is a 43 year old male who presents for an extension of short-term disability due to ongoing therapy sessions.  He requests extension of short-term disability from January 3 to May 16, 2024, to cover ongoing therapy sessions. Current disability forms are already completed through January 7, but there is a discrepancy with a prior note listing January 3.  He is under the care of a new psychiatrist, Dr. Kapoor, who increased his citalopram to 40 mg. He has had two visits with her and feels comfortable with her care, including support in connecting with therapy.  His wife recently had precancerous colon polyps. He was in the prediabetic range last year and is aware of his metabolic risk. He has not fasted today, having had French fries and coffee, which may affect any same-day cholesterol testing.   Review of Systems  Psychiatric/Behavioral:  The patient is nervous/anxious and has insomnia.         Objective:    BP 124/80 (Cuff Size: Large)   Pulse 75   Temp 98.8 F (37.1 C) (Oral)   Resp 16   Ht 5' 9 (1.753 m)   Wt 248 lb 12.8 oz (112.9 kg)   SpO2 97%   BMI 36.74 kg/m  BP Readings from Last 3 Encounters:  05/07/24 124/80  04/19/24 120/72  04/11/24 126/78   Wt Readings from Last 3 Encounters:  05/07/24 248 lb 12.8 oz (112.9 kg)  04/19/24 249 lb 12.8 oz (113.3 kg)  04/11/24 249 lb 1.6 oz (113 kg)      Physical Exam Constitutional:      Appearance: Normal appearance.  HENT:     Head: Normocephalic and atraumatic.  Eyes:     Conjunctiva/sclera:  Conjunctivae normal.  Cardiovascular:     Rate and Rhythm: Normal rate and regular rhythm.  Pulmonary:     Effort: Pulmonary effort is normal.     Breath sounds: Normal breath sounds.  Skin:    General: Skin is warm and dry.  Neurological:     General: No focal deficit present.     Mental Status: He is alert. Mental status is at baseline.  Psychiatric:        Mood and Affect: Mood is anxious.        Speech: Speech normal.        Behavior: Behavior is cooperative.        Thought Content: Thought content normal.     No results found for any visits on 05/07/24.      Assessment & Plan:   Assessment & Plan  Extension of short-term disability for ongoing therapy/Anxiety Requires extension of short-term disability until January 7th, 2026, due to ongoing therapy sessions. Cleared to return to work on January 7th, 2026, for full duty. - Provided letter stating medical opinion to extend short-term disability until January 7th, 2026. - Cleared to return to work on January 7th, 2026, for full duty. Anything after this will need to be completed by specialist.   Prediabetes Previously identified as prediabetic. Due  for re-evaluation of A1c levels to assess current status. - Ordered A1c test to re-evaluate prediabetes status.  General Health Maintenance Due for routine blood work including cholesterol, kidney, liver, and electrolytes. Advised to return fasting for accurate results. - Ordered fasting blood work including cholesterol, kidney, liver, and electrolytes. - Advised to return fasting for blood work this week.  - CBC w/Diff/Platelet - Comprehensive Metabolic Panel (CMET) - Lipid Profile - citalopram (CELEXA) 40 MG tablet; Take 1 tablet (40 mg total) by mouth daily.   Return for already scheduled.  Randall Fischer, DO   "

## 2024-05-15 ENCOUNTER — Ambulatory Visit: Admitting: Internal Medicine

## 2024-05-16 NOTE — Progress Notes (Signed)
 Name: Randall Stanley   MRN: [id number]    DOB: [birth year]   Date:05/17/2024       Progress Note  Subjective  Chief Complaint  Chief Complaint  Patient presents with   Annual Exam    HPI  Patient presents for annual CPE.  Discussed the use of AI scribe software for clinical note transcription with the patient, who gave verbal consent to proceed.  History of Present Illness Randall Stanley is a 44 year old male who presents for an annual physical exam.  He feels more tired lately without specific associated symptoms. He is under increased stress due to problems with his FMLA paperwork. He reports vision changes and does not use corrective lenses. 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 He has very dry, cracked heels that previously formed large fissures. They have improved with podiatry care and home use of a pumice stone and moisturizers. He does not receive the flu vaccine annually.   Diet: regular Exercise: None Last Dental Exam: None Last Eye Exam: None - discussed starting regular eye exams at age 87, patient   Depression: phq 9 is positive    05/07/2024    8:06 AM 04/11/2024    1:11 PM 09/29/2023    1:07 PM 08/18/2023    2:26 PM 06/20/2023    2:46 PM  Depression screen PHQ 2/9  Decreased Interest 3 3 2 1 1   Down, Depressed, Hopeless 2 3 1 1 1   PHQ - 2 Score 5 6 3 2 2   Altered sleeping 3 3 2 1 2   Tired, decreased energy 3 3 2 1 1   Change in appetite 3 3 3 1 1   Feeling bad or failure about yourself  2 2 2  0 1  Trouble concentrating 3 3 2 1 1   Moving slowly or fidgety/restless 3 3 2  0 2  Suicidal thoughts 0 0 0 0 0  PHQ-9 Score 22 23 16  6  10    Difficult doing work/chores Somewhat difficult Very difficult Very difficult Somewhat difficult Not difficult at all     Data saved with a previous flowsheet row definition    Hypertension:  BP Readings from Last 3 Encounters:  05/17/24 124/82  05/07/24 124/80  04/19/24 120/72     Obesity: Wt Readings from Last 3 Encounters:  05/17/24 245 lb 14.4 oz (111.5 kg)  05/07/24 248 lb 12.8 oz (112.9 kg)  04/19/24 249 lb 12.8 oz (113.3 kg)   BMI Readings from Last 3 Encounters:  05/17/24 36.31 kg/m  05/07/24 36.74 kg/m  04/19/24 36.89 kg/m     [employer] Visit from 05/17/2024 in [facility]  AUDIT-C Score 0    Married STD testing and prevention (HIV/chl/gon/syphilis):  not applicable Sexual history: active Hep C Screening: completed Skin cancer: Discussed monitoring for atypical lesions Colorectal cancer: will schedule Prostate cancer:  not applicable No results found for: PSA   Lung cancer:  Low Dose CT Chest recommended if Age 26-80 years, 30 pack-year currently smoking OR have quit w/in 15years. Patient  is not a candidate for screening   AAA: The USPSTF recommends one-time screening with ultrasonography in men ages 66 to 75 years who have ever smoked. Patient   is not a candidate for screening  ECG:  NA  Vaccines: reviewed with the patient. Flu vaccine today.   Advanced Care Planning: A voluntary discussion about advance care planning including the explanation and discussion of advance directives.  Discussed health care proxy and Living will, and the patient was able to identify a health care proxy as April Eves (wife).  Patient does not have a living will and power of attorney of health care   Patient Active Problem List   Diagnosis Date Noted   Prediabetes 06/20/2023   Posterior cervical lymphadenopathy 02/08/2022   History of prediabetes 02/08/2022   History of gout 02/08/2022   Anxiety 02/08/2022   GERD (gastroesophageal reflux disease) 09/05/2015   Gout 09/05/2015    Past Surgical History:  Procedure Laterality Date   CARPAL TUNNEL WITH CUBITAL TUNNEL Right 06/01/2019   Procedure: RIGHT CARPAL TUNNEL AND RIGHT ULNAR NERVE DECOMPRESSION AT RIGHT ELBOW;  Surgeon: Murrell Drivers, MD;  Location: [facility];  Service: Orthopedics;  Laterality: Right;  hand and upper arm    Family History  Problem Relation Age of Onset   COPD Mother    Hyperlipidemia Father    Stroke Father    Rheumatic fever Father    Heart attack Father    Kidney failure Father    Diabetes Father    GI Bleed Father    Drug abuse Sister    Parkinson's disease Maternal Grandmother    Autism Daughter    Depression Adopted Daughter     Social History   Socioeconomic History   Marital status: Married    Spouse name: April   Number of children: Not on file   Years of education: Not on file   Highest education level: Not on file  Occupational History   Not on file  Tobacco Use   Smoking status: Never   Smokeless tobacco: Never  Vaping Use   Vaping status: Never Used  Substance and Sexual Activity   Alcohol use: Yes    Alcohol/week: 0.0 standard drinks of alcohol    Comment: occasional   Drug use: No   Sexual activity: Yes  Other Topics Concern   Not on file  Social History Narrative   Married.   2 children   Works as a child psychotherapist.   Enjoys gardening   Social Drivers of Health   Tobacco Use: Low Risk (05/17/2024)   Patient History    Smoking Tobacco Use: Never    Smokeless Tobacco Use: Never    Passive Exposure: Not on file  Financial Resource Strain: Low Risk (05/17/2024)   Overall Financial Resource Strain (CARDIA)    Difficulty of Paying Living Expenses: Not hard at all  Food Insecurity: No Food Insecurity (05/17/2024)   Epic    Worried About Programme Researcher, Broadcasting/film/video in the Last Year: Never true    Ran Out of Food in the Last Year: Never true  Transportation Needs: No Transportation Needs (05/17/2024)   Epic    Lack of Transportation (Medical): No    Lack of Transportation (Non-Medical): No  Physical Activity: Inactive (05/17/2024)   Exercise Vital Sign    Days of Exercise per Week: 0 days    Minutes of Exercise per Session: 0 min  Stress: Stress Concern Present (05/17/2024)    [employer] of Occupational Health - Occupational Stress Questionnaire    Feeling of Stress: To some extent  Social Connections: Moderately Isolated (05/17/2024)   Social Connection and Isolation Panel    Frequency of Communication with Friends and Family: More than three times a week    Frequency of Social Gatherings with Friends and Family: More than three times a week    Attends Religious Services: Never  Active Member of Clubs or Organizations: No    Attends Banker Meetings: Never    Marital Status: Married  Catering Manager Violence: Not At Risk (05/17/2024)   Epic    Fear of Current or Ex-Partner: No    Emotionally Abused: No    Physically Abused: No    Sexually Abused: No  Depression (PHQ2-9): High Risk (05/07/2024)   Depression (PHQ2-9)    PHQ-2 Score: 22  Alcohol Screen: Low Risk (05/17/2024)   Alcohol Screen    Last Alcohol Screening Score (AUDIT): 0  Housing: Unknown (05/17/2024)   Epic    Unable to Pay for Housing in the Last Year: No    Number of Times Moved in the Last Year: Not on file    Homeless in the Last Year: No  Utilities: Not At Risk (05/17/2024)   Epic    Threatened with loss of utilities: No  Health Literacy: Adequate Health Literacy (05/17/2024)   B1300 Health Literacy    Frequency of need for help with medical instructions: Never    Current Medications[1]  Allergies[2]   Review of Systems  All other systems reviewed and are negative.   Objective  Vitals:   05/17/24 0755  BP: 124/82  Pulse: 80  Resp: 16  Temp: 98.4 F (36.9 C)  TempSrc: Oral  Weight: 245 lb 14.4 oz (111.5 kg)  Height: 5' 9 (1.753 m)    Body mass index is 36.31 kg/m.  Physical Exam Constitutional:      Appearance: Normal appearance.  HENT:     Head: Normocephalic and atraumatic.     Mouth/Throat:     Mouth: Mucous membranes are moist.     Pharynx: Oropharynx is clear.  Eyes:     Extraocular Movements: Extraocular movements intact.      Conjunctiva/sclera: Conjunctivae normal.     Pupils: Pupils are equal, round, and reactive to light.  Neck:     Comments: No thyromegaly Cardiovascular:     Rate and Rhythm: Normal rate and regular rhythm.  Pulmonary:     Effort: Pulmonary effort is normal.     Breath sounds: Normal breath sounds.  Musculoskeletal:     Cervical back: No tenderness.     Right lower leg: No edema.     Left lower leg: No edema.  Lymphadenopathy:     Cervical: No cervical adenopathy.  Skin:    General: Skin is warm and dry.  Neurological:     General: No focal deficit present.     Mental Status: He is alert. Mental status is at baseline.  Psychiatric:        Mood and Affect: Mood normal.        Behavior: Behavior normal.     Last CBC Lab Results  Component Value Date   WBC 8.5 11/09/2023   HGB 16.1 11/09/2023   HCT 47.3 11/09/2023   MCV 89.1 11/09/2023   MCH 30.3 11/09/2023   RDW 12.3 11/09/2023   PLT 263 11/09/2023   Last metabolic panel Lab Results  Component Value Date   GLUCOSE 145 (H) 11/09/2023   NA 137 11/09/2023   K 4.0 11/09/2023   CL 100 11/09/2023   CO2 28 11/09/2023   BUN 14 11/09/2023   CREATININE 1.03 11/09/2023   GFRNONAA >60 11/09/2023   CALCIUM 9.2 11/09/2023   PROT 7.6 11/09/2023   ALBUMIN 4.2 11/09/2023   BILITOT 0.6 11/09/2023   ALKPHOS 39 11/09/2023   AST 32 11/09/2023   ALT 48 (H)  11/09/2023   ANIONGAP 9 11/09/2023   Last lipids Lab Results  Component Value Date   CHOL 138 05/05/2023   HDL 42 05/05/2023   LDLCALC 79 05/05/2023   TRIG 83 05/05/2023   CHOLHDL 3.3 05/05/2023   Last hemoglobin A1c Lab Results  Component Value Date   HGBA1C 6.3 (H) 05/05/2023   Last thyroid  functions No results found for: TSH, T3TOTAL, T4TOTAL, FREET4, THYROIDAB Last vitamin D No results found for: 25OHVITD2, 25OHVITD3, VD25OH Last vitamin B12 and Folate No results found for: VITAMINB12, FOLATE    Assessment & Plan  Assessment &  Plan Prediabetes Monitored with A1c testing. - Ordered A1c test.  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 - Recommended consistent use of pumice stone and moisturizer twice daily. - Advised sleeping with socks and cream wrapped in saran wrap.  Annual Physical/General Health Maintenance Routine health maintenance discussed, including cancer screenings and vaccinations. Colonoscopy recommended as the gold standard for colon cancer screening. Cologuard discussed as an alternative but less specific option. PSA screening for prostate cancer to start at age 80. Flu vaccination recommended. - Referred to GI for colonoscopy. - Administered flu vaccine. - Recommended annual eye exam. - Plan PSA screening at age 20.  - Flu vaccine trivalent PF, 6mos and older(Flulaval,Afluria,Fluarix,Fluzone) - CBC w/Diff/Platelet - Comprehensive Metabolic Panel (CMET) - Lipid Profile - HgB A1c - Ambulatory referral to Gastroenterology   -Prostate cancer screening and PSA options (with potential risks and benefits of testing vs not testing) were discussed along with recent recs/guidelines. -USPSTF grade A and B recommendations reviewed with patient; age-appropriate recommendations, preventive care, screening tests, etc discussed and encouraged; healthy living encouraged; see AVS for patient education given to patient -Discussed importance of 150 minutes of physical activity weekly, eat two servings of fish weekly, eat one serving of tree nuts ( cashews, pistachios, pecans, almonds.SABRA) every other day, eat 6 servings of fruit/vegetables daily and drink plenty of water and avoid sweet beverages.  -Reviewed Health Maintenance: yes      [1]  Current Outpatient Medications:    citalopram (CELEXA) 40 MG tablet, Take 1 tablet (40 mg total) by mouth daily., Disp: , Rfl:    hydrOXYzine  (ATARAX ) 10 MG tablet, TAKE 1 TABLET (10 MG TOTAL) BY MOUTH AT BEDTIME AS NEEDED FOR  ANXIETY, Disp: 30 tablet, Rfl: 0   naproxen  (NAPROSYN ) 500 MG tablet, TAKE 1 TABLET (500 MG TOTAL) BY MOUTH DAILY AS NEEDED FOR MODERATE PAIN (PAIN SCORE 4-6)., Disp: 30 tablet, Rfl: 1   traZODone (DESYREL) 50 MG tablet, Take 50 mg by mouth at bedtime., Disp: , Rfl:  [2] No Known Allergies

## 2024-05-17 ENCOUNTER — Ambulatory Visit: Admitting: Clinical

## 2024-05-17 ENCOUNTER — Encounter: Payer: Self-pay | Admitting: Internal Medicine

## 2024-05-17 ENCOUNTER — Ambulatory Visit: Admitting: Internal Medicine

## 2024-05-17 ENCOUNTER — Other Ambulatory Visit: Payer: Self-pay

## 2024-05-17 VITALS — BP 124/82 | HR 80 | Temp 98.4°F | Resp 16 | Ht 69.0 in | Wt 245.9 lb

## 2024-05-17 DIAGNOSIS — Z13220 Encounter for screening for lipoid disorders: Secondary | ICD-10-CM | POA: Diagnosis not present

## 2024-05-17 DIAGNOSIS — Z1211 Encounter for screening for malignant neoplasm of colon: Secondary | ICD-10-CM | POA: Diagnosis not present

## 2024-05-17 DIAGNOSIS — R7303 Prediabetes: Secondary | ICD-10-CM | POA: Diagnosis not present

## 2024-05-17 DIAGNOSIS — Z0001 Encounter for general adult medical examination with abnormal findings: Secondary | ICD-10-CM | POA: Diagnosis not present

## 2024-05-17 DIAGNOSIS — F4322 Adjustment disorder with anxiety: Secondary | ICD-10-CM

## 2024-05-17 DIAGNOSIS — Z23 Encounter for immunization: Secondary | ICD-10-CM

## 2024-05-17 DIAGNOSIS — Z0000 Encounter for general adult medical examination without abnormal findings: Secondary | ICD-10-CM
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
## 2024-05-17 NOTE — Progress Notes (Signed)
   Randall Seats, LCSW

## 2024-05-18 ENCOUNTER — Ambulatory Visit: Payer: Self-pay | Admitting: Internal Medicine

## 2024-05-18 LAB — COMPREHENSIVE METABOLIC PANEL WITH GFR
AG Ratio: 1.5 (calc) (ref 1.0–2.5)
ALT: 42 U/L (ref 9–46)
AST: 29 U/L (ref 10–40)
Albumin: 4.8 g/dL (ref 3.6–5.1)
Alkaline phosphatase (APISO): 52 U/L (ref 36–130)
BUN: 10 mg/dL (ref 7–25)
CO2: 27 mmol/L (ref 20–32)
Calcium: 9.5 mg/dL (ref 8.6–10.3)
Chloride: 100 mmol/L (ref 98–110)
Creat: 0.98 mg/dL (ref 0.60–1.29)
Globulin: 3.1 g/dL (ref 1.9–3.7)
Glucose, Bld: 87 mg/dL (ref 65–99)
Potassium: 4.5 mmol/L (ref 3.5–5.3)
Sodium: 137 mmol/L (ref 135–146)
Total Bilirubin: 0.5 mg/dL (ref 0.2–1.2)
Total Protein: 7.9 g/dL (ref 6.1–8.1)
eGFR: 98 mL/min/1.73m2

## 2024-05-18 LAB — CBC WITH DIFFERENTIAL/PLATELET
Absolute Lymphocytes: 2449 {cells}/uL (ref 850–3900)
Absolute Monocytes: 813 {cells}/uL (ref 200–950)
Basophils Absolute: 50 {cells}/uL (ref 0–200)
Basophils Relative: 0.6 %
Eosinophils Absolute: 183 {cells}/uL (ref 15–500)
Eosinophils Relative: 2.2 %
HCT: 51.5 % — ABNORMAL HIGH (ref 39.4–51.1)
Hemoglobin: 17.3 g/dL — ABNORMAL HIGH (ref 13.2–17.1)
MCH: 29.7 pg (ref 27.0–33.0)
MCHC: 33.6 g/dL (ref 31.6–35.4)
MCV: 88.3 fL (ref 81.4–101.7)
MPV: 9.2 fL (ref 7.5–12.5)
Monocytes Relative: 9.8 %
Neutro Abs: 4806 {cells}/uL (ref 1500–7800)
Neutrophils Relative %: 57.9 %
Platelets: 317 Thousand/uL (ref 140–400)
RBC: 5.83 Million/uL — ABNORMAL HIGH (ref 4.20–5.80)
RDW: 12.8 % (ref 11.0–15.0)
Total Lymphocyte: 29.5 %
WBC: 8.3 Thousand/uL (ref 3.8–10.8)

## 2024-05-18 LAB — LIPID PANEL
Cholesterol: 149 mg/dL
HDL: 41 mg/dL
LDL Cholesterol (Calc): 93 mg/dL
Non-HDL Cholesterol (Calc): 108 mg/dL
Total CHOL/HDL Ratio: 3.6 (calc)
Triglycerides: 64 mg/dL

## 2024-05-18 LAB — HEMOGLOBIN A1C
Hgb A1c MFr Bld: 5.9 % — ABNORMAL HIGH
Mean Plasma Glucose: 123 mg/dL
eAG (mmol/L): 6.8 mmol/L

## 2024-06-27 ENCOUNTER — Ambulatory Visit: Admitting: Clinical

## 2024-11-14 ENCOUNTER — Ambulatory Visit: Admitting: Internal Medicine

## 2025-05-20 ENCOUNTER — Encounter: Admitting: Internal Medicine
